# Patient Record
Sex: Female | Born: 1975 | Race: Black or African American | Hispanic: No | Marital: Married | Smoking: Never smoker
Health system: Southern US, Community
[De-identification: ages and names within clinical notes are randomized; demographics above are authoritative.]

## PROBLEM LIST (undated history)

## (undated) DIAGNOSIS — E119 Type 2 diabetes mellitus without complications: Secondary | ICD-10-CM

## (undated) HISTORY — DX: Type 2 diabetes mellitus without complications: E11.9

---

## 2013-05-19 ENCOUNTER — Emergency Department (INDEPENDENT_AMBULATORY_CARE_PROVIDER_SITE_OTHER)
Admission: EM | Admit: 2013-05-19 | Discharge: 2013-05-19 | Disposition: A | Discharge status: Home / Self Care | Payer: Medicaid Other | Source: Home / Self Care | Attending: Family Medicine | Admitting: Family Medicine

## 2013-05-19 ENCOUNTER — Encounter (HOSPITAL_COMMUNITY): Payer: Self-pay | Admitting: Emergency Medicine

## 2013-05-19 DIAGNOSIS — J309 Allergic rhinitis, unspecified: Secondary | ICD-10-CM

## 2013-05-19 MED ORDER — CETIRIZINE HCL 10 MG PO TABS: 10.0000 mg | ORAL_TABLET | Freq: Every day | ORAL | Status: DC

## 2013-05-19 MED ORDER — FLUTICASONE PROPIONATE 50 MCG/ACT NA SUSP: 2.0000 | Freq: Every day | NASAL | Status: DC

## 2013-05-19 NOTE — Discharge Instructions (Signed)
Allergies °Allergies may happen from anything your body is sensitive to. This may be food, medicines, pollens, chemicals, and nearly anything around you in everyday life that produces allergens. An allergen is anything that causes an allergy producing substance. Heredity is often a factor in causing these problems. This means you may have some of the same allergies as your parents. °Food allergies happen in all age groups. Food allergies are some of the most severe and life threatening. Some common food allergies are cow's milk, seafood, eggs, nuts, wheat, and soybeans. °SYMPTOMS  °· Swelling around the mouth. °· An itchy red rash or hives. °· Vomiting or diarrhea. °· Difficulty breathing. °SEVERE ALLERGIC REACTIONS ARE LIFE-THREATENING. °This reaction is called anaphylaxis. It can cause the mouth and throat to swell and cause difficulty with breathing and swallowing. In severe reactions only a trace amount of food (for example, peanut oil in a salad) may cause death within seconds. °Seasonal allergies occur in all age groups. These are seasonal because they usually occur during the same season every year. They may be a reaction to molds, grass pollens, or tree pollens. Other causes of problems are house dust mite allergens, pet dander, and mold spores. The symptoms often consist of nasal congestion, a runny itchy nose associated with sneezing, and tearing itchy eyes. There is often an associated itching of the mouth and ears. The problems happen when you come in contact with pollens and other allergens. Allergens are the particles in the air that the body reacts to with an allergic reaction. This causes you to release allergic antibodies. Through a chain of events, these eventually cause you to release histamine into the blood stream. Although it is meant to be protective to the body, it is this release that causes your discomfort. This is why you were given anti-histamines to feel better.  If you are unable to  pinpoint the offending allergen, it may be determined by skin or blood testing. Allergies cannot be cured but can be controlled with medicine. °Hay fever is a collection of all or some of the seasonal allergy problems. It may often be treated with simple over-the-counter medicine such as diphenhydramine. Take medicine as directed. Do not drink alcohol or drive while taking this medicine. Check with your caregiver or package insert for child dosages. °If these medicines are not effective, there are many new medicines your caregiver can prescribe. Stronger medicine such as nasal spray, eye drops, and corticosteroids may be used if the first things you try do not work well. Other treatments such as immunotherapy or desensitizing injections can be used if all else fails. Follow up with your caregiver if problems continue. These seasonal allergies are usually not life threatening. They are generally more of a nuisance that can often be handled using medicine. °HOME CARE INSTRUCTIONS  °· If unsure what causes a reaction, keep a diary of foods eaten and symptoms that follow. Avoid foods that cause reactions. °· If hives or rash are present: °· Take medicine as directed. °· You may use an over-the-counter antihistamine (diphenhydramine) for hives and itching as needed. °· Apply cold compresses (cloths) to the skin or take baths in cool water. Avoid hot baths or showers. Heat will make a rash and itching worse. °· If you are severely allergic: °· Following a treatment for a severe reaction, hospitalization is often required for closer follow-up. °· Wear a medic-alert bracelet or necklace stating the allergy. °· You and your family must learn how to give adrenaline or use   an anaphylaxis kit. °· If you have had a severe reaction, always carry your anaphylaxis kit or EpiPen® with you. Use this medicine as directed by your caregiver if a severe reaction is occurring. Failure to do so could have a fatal outcome. °SEEK MEDICAL  CARE IF: °· You suspect a food allergy. Symptoms generally happen within 30 minutes of eating a food. °· Your symptoms have not gone away within 2 days or are getting worse. °· You develop new symptoms. °· You want to retest yourself or your child with a food or drink you think causes an allergic reaction. Never do this if an anaphylactic reaction to that food or drink has happened before. Only do this under the care of a caregiver. °SEEK IMMEDIATE MEDICAL CARE IF:  °· You have difficulty breathing, are wheezing, or have a tight feeling in your chest or throat. °· You have a swollen mouth, or you have hives, swelling, or itching all over your body. °· You have had a severe reaction that has responded to your anaphylaxis kit or an EpiPen®. These reactions may return when the medicine has worn off. These reactions should be considered life threatening. °MAKE SURE YOU:  °· Understand these instructions. °· Will watch your condition. °· Will get help right away if you are not doing well or get worse. °Document Released: 05/19/2002 Document Revised: 06/20/2012 Document Reviewed: 10/24/2007 °ExitCare® Patient Information ©2014 [employer]. ° °

## 2013-05-19 NOTE — ED Provider Notes (Signed)
CSN: [id number]     Arrival date & time 05/19/13  [id number] History   None    Chief Complaint  Patient presents with  . URI   (Consider location/radiation/quality/duration/timing/severity/associated sxs/prior Treatment) HPI Comments: History obtained via [employer] interpreters for [place] Arabic  38 year old female who is a recent immigrant [place] 3 [place] ago presents complaining of runny nose, nasal congestion, dry cough, and headache. She relates this to a change in the weather and thinks she may have allergies or a sinus infection. She has never had allergies before. Her symptoms began one week after arriving to the [place], or 2 weeks ago. She denies fever, chills, NVD, chest pain, shortness of breath. She has not tried taking anything for this at home.   History reviewed. No pertinent past medical history. History reviewed. No pertinent past surgical history. History reviewed. No pertinent family history. History  Substance Use Topics  . Smoking status: Never Smoker   . Smokeless tobacco: Not on file  . Alcohol Use: No   OB History   Grav Para Term Preterm Abortions TAB SAB Ect Mult Living                 Review of Systems  Constitutional: Negative for fever and chills.  HENT: Positive for congestion, rhinorrhea and sinus pressure.   Eyes: Negative for visual disturbance.  Respiratory: Positive for cough. Negative for chest tightness and shortness of breath.   Cardiovascular: Negative for chest pain, palpitations and leg swelling.  Gastrointestinal: Negative for nausea, vomiting and abdominal pain.  Endocrine: Negative for polydipsia and polyuria.  Genitourinary: Negative for dysuria, urgency and frequency.  Musculoskeletal: Negative for arthralgias and myalgias.  Skin: Negative for rash.  Neurological: Positive for headaches. Negative for dizziness, weakness and light-headedness.    Allergies  Review of patient's allergies indicates no known  allergies.  Home Medications   Current Outpatient Rx  Name  Route  Sig  Dispense  Refill  . cetirizine (ZYRTEC) 10 MG tablet   Oral   Take 1 tablet (10 mg total) by mouth daily.   30 tablet   0   . fluticasone (FLONASE) 50 MCG/ACT nasal spray   Each Nare   Place 2 sprays into both nostrils daily.   16 g   2    BP 106/69  Pulse 85  Temp(Src) 98.8 F (37.1 C) (Oral)  Resp 16  SpO2 97% Physical Exam  Nursing note and vitals reviewed. Constitutional: She is oriented to person, place, and time. Vital signs are normal. She appears well-developed and well-nourished. No distress.  HENT:  Head: Normocephalic and atraumatic.  Nose: Mucosal edema and rhinorrhea present. Right sinus exhibits no maxillary sinus tenderness and no frontal sinus tenderness. Left sinus exhibits no maxillary sinus tenderness and no frontal sinus tenderness.  Mouth/Throat: Uvula is midline, oropharynx is clear and moist and mucous membranes are normal.  Cardiovascular: Normal rate, regular rhythm and normal heart sounds.  Exam reveals no gallop and no friction rub.   No murmur heard. Pulmonary/Chest: Effort normal and breath sounds normal. No respiratory distress. She has no wheezes. She has no rales.  Lymphadenopathy:       Head (right side): No submandibular, no tonsillar, no preauricular and no posterior auricular adenopathy present.       Head (left side): No submandibular, no tonsillar, no preauricular and no posterior auricular adenopathy present.    She has no cervical adenopathy.  Neurological: She is alert and oriented to person,  place, and time. She has normal strength. Coordination normal.  Skin: Skin is warm and dry. No rash noted. She is not diaphoretic.  Psychiatric: She has a normal mood and affect. Judgment normal.    ED Course  Procedures (including critical care time) Labs Review Labs Reviewed - No data to display Imaging Review No results found.   MDM   1. Allergic rhinitis     Treating allergic rhinitis with daily Zyrtec and daily Flonase. She has a followup appointment for 10 days from now with a new primary care physician so they can assess her and make any changes as needed. Followup here as needed for any worsening. Afebrile without lymphadenopathy or sinus tenderness so do not suspect sinusitis at this time   Meds ordered this encounter  Medications  . fluticasone (FLONASE) 50 MCG/ACT nasal spray    Sig: Place 2 sprays into both nostrils daily.    Dispense:  16 g    Refill:  2    Order Specific Question:  Supervising Provider    Answer:  Clementeen Graham, S [id number]  . cetirizine (ZYRTEC) 10 MG tablet    Sig: Take 1 tablet (10 mg total) by mouth daily.    Dispense:  30 tablet    Refill:  0    Order Specific Question:  Supervising Provider    Answer:  Clementeen Graham, Kathie Rhodes [[id number]]       Graylon Good, PA-C 05/19/13 [phone]

## 2013-05-19 NOTE — ED Notes (Signed)
URI symptoms; does not speak English , will need Education officer, communityacific Interpreter for full triage

## 2013-05-20 NOTE — ED Provider Notes (Signed)
Medical screening examination/treatment/procedure(s) were performed by resident physician or non-physician practitioner and as supervising physician I was immediately available for consultation/collaboration.   KINDL,JAMES DOUGLAS MD.   James D Kindl, MD 05/20/13 1411 

## 2013-05-29 ENCOUNTER — Ambulatory Visit (INDEPENDENT_AMBULATORY_CARE_PROVIDER_SITE_OTHER): Payer: Medicaid Other | Admitting: Family Medicine

## 2013-05-29 ENCOUNTER — Ambulatory Visit: Payer: Self-pay

## 2013-05-29 ENCOUNTER — Encounter: Payer: Self-pay | Admitting: Family Medicine

## 2013-05-29 VITALS — BP 112/72 | HR 90 | Temp 98.4°F | Ht 62.25 in | Wt 138.9 lb

## 2013-05-29 DIAGNOSIS — IMO0001 Reserved for inherently not codable concepts without codable children: Secondary | ICD-10-CM

## 2013-05-29 DIAGNOSIS — Z609 Problem related to social environment, unspecified: Secondary | ICD-10-CM

## 2013-05-29 DIAGNOSIS — Z309 Encounter for contraceptive management, unspecified: Secondary | ICD-10-CM

## 2013-05-29 DIAGNOSIS — Z603 Acculturation difficulty: Secondary | ICD-10-CM

## 2013-05-29 DIAGNOSIS — M549 Dorsalgia, unspecified: Secondary | ICD-10-CM

## 2013-05-29 DIAGNOSIS — M25559 Pain in unspecified hip: Secondary | ICD-10-CM

## 2013-05-29 LAB — POCT URINALYSIS DIPSTICK
Bilirubin, UA: NEGATIVE
Blood, UA: NEGATIVE
Glucose, UA: NEGATIVE
KETONES UA: NEGATIVE
Leukocytes, UA: NEGATIVE
Nitrite, UA: NEGATIVE
PROTEIN UA: NEGATIVE
SPEC GRAV UA: 1.015
Urobilinogen, UA: 0.2
pH, UA: 7

## 2013-05-29 LAB — COMPREHENSIVE METABOLIC PANEL
ALT: 16 U/L (ref 0–35)
AST: 16 U/L (ref 0–37)
Albumin: 4.2 g/dL (ref 3.5–5.2)
Alkaline Phosphatase: 76 U/L (ref 39–117)
BILIRUBIN TOTAL: 0.3 mg/dL (ref 0.2–1.2)
BUN: 17 mg/dL (ref 6–23)
CO2: 26 meq/L (ref 19–32)
Calcium: 9.3 mg/dL (ref 8.4–10.5)
Chloride: 103 mEq/L (ref 96–112)
Creat: 0.78 mg/dL (ref 0.50–1.10)
GLUCOSE: 106 mg/dL — AB (ref 70–99)
POTASSIUM: 3.8 meq/L (ref 3.5–5.3)
SODIUM: 139 meq/L (ref 135–145)
TOTAL PROTEIN: 7 g/dL (ref 6.0–8.3)

## 2013-05-29 LAB — CBC WITH DIFFERENTIAL/PLATELET
Basophils Absolute: 0 10*3/uL (ref 0.0–0.1)
Basophils Relative: 0 % (ref 0–1)
Eosinophils Absolute: 0.1 10*3/uL (ref 0.0–0.7)
Eosinophils Relative: 1 % (ref 0–5)
HEMATOCRIT: 37.6 % (ref 36.0–46.0)
HEMOGLOBIN: 12.5 g/dL (ref 12.0–15.0)
LYMPHS ABS: 1.5 10*3/uL (ref 0.7–4.0)
Lymphocytes Relative: 25 % (ref 12–46)
MCH: 26.3 pg (ref 26.0–34.0)
MCHC: 33.2 g/dL (ref 30.0–36.0)
MCV: 79 fL (ref 78.0–100.0)
MONO ABS: 0.3 10*3/uL (ref 0.1–1.0)
Monocytes Relative: 5 % (ref 3–12)
NEUTROS PCT: 69 % (ref 43–77)
Neutro Abs: 4.2 10*3/uL (ref 1.7–7.7)
Platelets: 295 10*3/uL (ref 150–400)
RBC: 4.76 MIL/uL (ref 3.87–5.11)
RDW: 14.6 % (ref 11.5–15.5)
WBC: 6.1 10*3/uL (ref 4.0–10.5)

## 2013-05-29 MED ORDER — NORGESTIMATE-ETH ESTRADIOL 0.25-35 MG-MCG PO TABS: 1.0000 | ORAL_TABLET | Freq: Every day | ORAL | Status: DC

## 2013-05-29 MED ORDER — IBUPROFEN 600 MG PO TABS: 600.0000 mg | ORAL_TABLET | Freq: Three times a day (TID) | ORAL | Status: DC | PRN

## 2013-05-29 NOTE — Progress Notes (Signed)
Patient ID: Destiny Walker, female   DOB: 09/12/1975, 38 y.o.   MRN: [id number]  Chief Complaint  Patient presents with  . Establish Care     HPI Destiny Walker is a 38 y.o.female who presents to immigrant clinic as a new patient. Arabic Interpreter is present for the office visit.  Her concerns include the following:  - pain in right lateral upper leg. Started 4-5 months ago when she fell on her bottom. Pain is worst at night and worst with laying on it. Pain is a stretchy type of pain that spans from the top of her lateral thigh past her knee on the lateral side. She denies any lower back pain. No lower extr weakness, numbness or bowel or urinary incontinence  - tooth ache in the upper left jaw. Has partial dentures and would like to see dentist.   Social History:  Arrived in [place]: 2 months ago Originally from [place] and lived [place] for the last 3 years. Her family left [place] due to political unrest.   Language: - Arabic and a [place] dialect - Requires Administratorintepreter (essentially speaks no [place])  Education: - 5th grade education in [place] - no formal employment. Works in the home  Preventative Care History: none Seen at Health Department: no  Contact:  [employer] Other:    History reviewed. No pertinent past medical history.  History reviewed. No pertinent past surgical history.  History reviewed. No pertinent family history.  Social History History  Substance Use Topics  . Smoking status: Never Smoker   . Smokeless tobacco: Not on file  . Alcohol Use: No    No Known Allergies  Current Outpatient Prescriptions  Medication Sig Dispense Refill  . cetirizine (ZYRTEC) 10 MG tablet Take 1 tablet (10 mg total) by mouth daily.  30 tablet  0  . fluticasone (FLONASE) 50 MCG/ACT nasal spray Place 2 sprays into both nostrils daily.  16 g  2  . ibuprofen (ADVIL,MOTRIN) 600 MG tablet Take 1 tablet (600 mg total) by mouth every 8 (eight) hours  as needed.  30 tablet  1  . norgestimate-ethinyl estradiol (ORTHO-CYCLEN,SPRINTEC,PREVIFEM) 0.25-35 MG-MCG tablet Take 1 tablet by mouth daily.  1 Package  11   No current facility-administered medications for this visit.    Review of Systems Review of Systems Negative except per HPI Blood pressure 112/72, pulse 90, temperature 98.4 F (36.9 C), temperature source Oral, height 5' 2.25" (1.581 m), weight 138 lb 14.4 oz (63.005 kg).  Physical Exam Physical Exam General: veiled woman with headdress. Pleasant, alert and oriented x4, no acute distress.  HEENT: PERRLA, EOMI, moist mucous membranes, partial dentures present on right upper front row of teeth, dental plaque present on teeth, no obvious cavities. Erythematous and congested nasal turbinates bilaterally CV: S1S2, RRR, no murmur Pulm: CTA bilaterally, normal work of breathing Abdomen: normal bowel sounds, non tender, non distended Right upper leg: no tenderness along greater trochanter, normal flexion and extension of hip, difficulty with FABER on right, pain with internal rotation of hip. Normal abduction strength bilaterally No tenderness along lower back Extremities: no pedal edema Data Reviewed [employer]  Assessment    38 yo female, [place] refugee who presents to establish care    Plan    1. Refugee Health: check UA, UCx, CMP, CBC 2. Lateral thigh pain: no evidence of trochanteric bursitis or low back pain. treat with NSAIDs for now and monitor improvement. If not better, consider xray  3. Contraception: patient asking for OCP's. rx for sprintec sent 4. Dental pain: Medicaid Dentist list given      Destiny Walker 05/29/2013, 10:10 PM

## 2013-05-29 NOTE — Assessment & Plan Note (Signed)
no evidence of trochanteric bursitis or low back pain. treat with NSAIDs for now and monitor improvement. If not better, consider imaging

## 2013-05-30 LAB — URINE CULTURE

## 2013-08-23 ENCOUNTER — Emergency Department (HOSPITAL_COMMUNITY)
Admission: EM | Admit: 2013-08-23 | Discharge: 2013-08-24 | Disposition: A | Discharge status: Home / Self Care | Payer: Medicaid Other | Attending: Emergency Medicine | Admitting: Emergency Medicine

## 2013-08-23 ENCOUNTER — Encounter (HOSPITAL_COMMUNITY): Payer: Self-pay | Admitting: Emergency Medicine

## 2013-08-23 DIAGNOSIS — K089 Disorder of teeth and supporting structures, unspecified: Secondary | ICD-10-CM | POA: Insufficient documentation

## 2013-08-23 DIAGNOSIS — K0889 Other specified disorders of teeth and supporting structures: Secondary | ICD-10-CM

## 2013-08-23 MED ORDER — OXYCODONE-ACETAMINOPHEN 5-325 MG PO TABS
1.0000 | ORAL_TABLET | Freq: Once | ORAL | Status: AC
  Administered 2013-08-23: 1 via ORAL
  Filled 2013-08-23: qty 1

## 2013-08-23 NOTE — ED Notes (Signed)
**  Information provided through family in room, pt will need Arabic interpretor  Pt c/o R lower dental pain x 2 weeks, tearful at times per family. No relief with orajel and Ibuprofen.

## 2013-08-23 NOTE — ED Notes (Signed)
Pt's friend at bedside reports pt has been c/o R lower dental pain x 1 month now.  Does not have a dentist because pt recently moved here.

## 2013-08-23 NOTE — ED Notes (Addendum)
CULTURAL NOTE: IF PT NEEDS TO UNDRESS ANY PART OF BODY, MALES NEED TO LEAVE RM SO AS NOT OFFEND THE PT. ALSO, ONLY FEMALES ARE PREFERRED FOR ALL TESTS AND PROCEDURES

## 2013-08-24 MED ORDER — HYDROCODONE-ACETAMINOPHEN 5-325 MG PO TABS: 1.0000 | ORAL_TABLET | ORAL | Status: DC | PRN

## 2013-08-24 NOTE — Discharge Instructions (Signed)
Take the prescribed medication as directed for pain. Follow-up with Dr. Lawrence Marseillesivils-- call and schedule appt. Return to the ED for new or worsening symptoms.

## 2013-08-24 NOTE — ED Provider Notes (Signed)
Medical screening examination/treatment/procedure(s) were performed by non-physician practitioner and as supervising physician I was immediately available for consultation/collaboration.   EKG Interpretation None        Najmo Pardue M Young Brim, MD 08/24/13 0357 

## 2013-08-24 NOTE — ED Provider Notes (Signed)
CSN: [id number]     Arrival date & time 08/23/13  2051 History   First MD Initiated Contact with Patient 08/23/13 2225     Chief Complaint  Patient presents with  . Dental Pain     (Consider location/radiation/quality/duration/timing/severity/associated sxs/prior Treatment) Patient is a 38 y.o. female presenting with tooth pain. The history is provided by the patient and medical records. The history is limited by a language barrier. A language interpreter was used.  Dental Pain  This is a 38 year old female presenting to the ED for right lower dental pain for the past 2 weeks.  Patient denies any any oral or dental trauma.  She denies any fever or chills.  Denies any facial swelling.  She is able to eat and drink without difficulty.  She's been taking ibuprofen and using topical Orajel without improvement. Patient is not currently established with a dentist.  History reviewed. No pertinent past medical history. History reviewed. No pertinent past surgical history. No family history on file. History  Substance Use Topics  . Smoking status: Never Smoker   . Smokeless tobacco: Not on file  . Alcohol Use: No   OB History   Grav Para Term Preterm Abortions TAB SAB Ect Mult Living                 Review of Systems  HENT: Positive for dental problem.   All other systems reviewed and are negative.     Allergies  Review of patient's allergies indicates no known allergies.  Home Medications   Prior to Admission medications   Medication Sig Start Date End Date Taking? Authorizing Provider  benzocaine (ORAJEL) 10 % mucosal gel Use as directed 1 application in the mouth or throat as needed for mouth pain.   Yes Historical Provider, MD  ibuprofen (ADVIL,MOTRIN) 200 MG tablet Take 400-600 mg by mouth every 6 (six) hours as needed (for pain).   Yes Historical Provider, MD   BP 149/73  Pulse 89  Temp(Src) 98.5 F (36.9 C) (Oral)  Resp 18  SpO2 99%  LMP 08/11/2013  Physical Exam   Nursing note and vitals reviewed. Constitutional: She is oriented to person, place, and time. She appears well-developed and well-nourished. No distress.  HENT:  Head: Normocephalic and atraumatic.  Mouth/Throat: Uvula is midline, oropharynx is clear and moist and mucous membranes are normal. No trismus in the jaw. Normal dentition. No dental abscesses or dental caries. No oropharyngeal exudate, posterior oropharyngeal edema, posterior oropharyngeal erythema or tonsillar abscesses.  Teeth largely in good dentition, right lower pre-molar TTP, surrounding gingiva normal in appearance without swelling or erythema to suggest dental abscess, handling secretions appropriately, no trismus  Eyes: Conjunctivae and EOM are normal. Pupils are equal, round, and reactive to light.  Neck: Normal range of motion. Neck supple.  Cardiovascular: Normal rate, regular rhythm and normal heart sounds.   Pulmonary/Chest: Effort normal and breath sounds normal. No respiratory distress. She has no wheezes.  Musculoskeletal: Normal range of motion.  Neurological: She is alert and oriented to person, place, and time.  Skin: Skin is warm and dry. She is not diaphoretic.  Psychiatric: She has a normal mood and affect.    ED Course  Procedures (including critical care time) Labs Review Labs Reviewed - No data to display  Imaging Review No results found.   EKG Interpretation None      MDM   Final diagnoses:  Pain, dental   Dental pain without signs of dental abscess. Patient was given  Percocet while in the ED with good improvement of her pain.  She will be discharged home with vicodin for pain control.  FU with dentist, referrals for Nuala AlphaJanna Civils given as patient prefers female physicians.  Discussed plan with patient, he/she acknowledged understanding and agreed with plan of care.  Return precautions given for new or worsening symptoms.  Garlon HatchetLisa M Sanders, PA-C 08/24/13 [phone]

## 2013-08-31 ENCOUNTER — Ambulatory Visit: Payer: Medicaid Other | Admitting: Family Medicine

## 2013-09-15 ENCOUNTER — Ambulatory Visit: Payer: Medicaid Other | Admitting: Family Medicine

## 2013-09-19 ENCOUNTER — Other Ambulatory Visit (HOSPITAL_COMMUNITY)
Admission: RE | Admit: 2013-09-19 | Discharge: 2013-09-19 | Disposition: A | Discharge status: Home / Self Care | Payer: Medicaid Other | Source: Ambulatory Visit | Attending: Family Medicine | Admitting: Family Medicine

## 2013-09-19 ENCOUNTER — Ambulatory Visit (INDEPENDENT_AMBULATORY_CARE_PROVIDER_SITE_OTHER): Payer: Medicaid Other | Admitting: Family Medicine

## 2013-09-19 ENCOUNTER — Encounter: Payer: Self-pay | Admitting: Family Medicine

## 2013-09-19 VITALS — BP 104/83 | HR 96 | Temp 99.8°F | Ht 62.25 in | Wt 139.0 lb

## 2013-09-19 DIAGNOSIS — Z113 Encounter for screening for infections with a predominantly sexual mode of transmission: Secondary | ICD-10-CM | POA: Insufficient documentation

## 2013-09-19 DIAGNOSIS — M6740 Ganglion, unspecified site: Secondary | ICD-10-CM

## 2013-09-19 DIAGNOSIS — N949 Unspecified condition associated with female genital organs and menstrual cycle: Secondary | ICD-10-CM

## 2013-09-19 DIAGNOSIS — R109 Unspecified abdominal pain: Secondary | ICD-10-CM

## 2013-09-19 DIAGNOSIS — R102 Pelvic and perineal pain: Secondary | ICD-10-CM

## 2013-09-19 NOTE — Patient Instructions (Signed)
We will get you set up for the ultrasound.  We are checking for infection.  Come back in 2-3 weeks so we can talk about your ultrasound results.  You can see me in my regular clinic or [facility].    It was good to see you today.

## 2013-09-19 NOTE — Progress Notes (Signed)
Subjective:    Destiny Walker is a 38 y.o. female who presents [facility] today for pelvic pain with menses:  1.  Pelvic pain with menses:  Present for 2 years (since birth of her youngest son) but worse in past 5 months.  No inciting triggers that she knows of.  Describes cramping abdominal pain which begins several days before menses.  Bleeding also heavier and more irregular than usual.  LMP was 2 weeks ago.  Still spotting somewhat.  Has not tried anything for relief.    No fevers or chills.  No nausea or vomiting.  Eating and drinking well.   Was told -- while in [place] -- that she had "uterine infection" and given medicine but never took it.  This was several months ago.  Describes some light white discharge.  No itching or burning.    2.  Left wrist pain:  Has "knot" on dorsum of her left wrist present for several months.  States appeared [place], but resolved ("popped") when she fell on it.    ROS as above per HPI, otherwise neg.    The following portions of the patient's history were reviewed and updated as appropriate: allergies, current medications, past medical history, family and social history, and problem list. Patient is a nonsmoker.    PMH reviewed.  No past medical history on file. No past surgical history on file.  Medications reviewed. Current Outpatient Prescriptions  Medication Sig Dispense Refill  . benzocaine (ORAJEL) 10 % mucosal gel Use as directed 1 application in the mouth or throat as needed for mouth pain.      Marland Kitchen. HYDROcodone-acetaminophen (NORCO/VICODIN) 5-325 MG per tablet Take 1 tablet by mouth every 4 (four) hours as needed.  12 tablet  0  . ibuprofen (ADVIL,MOTRIN) 200 MG tablet Take 400-600 mg by mouth every 6 (six) hours as needed (for pain).       No current facility-administered medications for this visit.     Objective:   Physical Exam BP 104/83  Pulse 96  Temp(Src) 99.8 F (37.7 C) (Oral)  Ht 5' 2.25" (1.581  m)  Wt 139 lb (63.05 kg)  BMI 25.22 kg/m2  LMP 09/05/2013 Gen:  Alert, cooperative patient who appears stated age in no acute distress.  Vital signs reviewed. HEENT: EOMI,  MMM Cardiac:  Regular rate and rhythm without murmur auscultated.   Pulm:  Clear to auscultation bilaterally with good air movement.   Abd:  Soft/nondistended.  RLQ tenderness present, mild.  No guarding or rebound.   Ext:  Left wrist with 1.5 cm nodule consistent with ganglion cyst on dorsum of Left hand.  Non-erythematous.    No results found for this or any previous visit (from the past 72 hour(s)).

## 2013-09-20 DIAGNOSIS — R102 Pelvic and perineal pain unspecified side: Secondary | ICD-10-CM | POA: Insufficient documentation

## 2013-09-20 DIAGNOSIS — M6740 Ganglion, unspecified site: Secondary | ICD-10-CM | POA: Insufficient documentation

## 2013-09-20 NOTE — Assessment & Plan Note (Signed)
Painful menstrual cramping.  Ibuprofen 800 mg for relief for now. With focal findings on exam.  Doesn't sound like she'd have a tubo-ovarian abscess -- but will obtain US to look for this or hemorrhagic cyst.

## 2013-09-20 NOTE — Assessment & Plan Note (Signed)
Desires treatment.   Did not have time today, but after discussion with patient will aspirate and provide corticosteroid injection at next visit in 2 weeks.

## 2013-09-25 ENCOUNTER — Ambulatory Visit (HOSPITAL_COMMUNITY): Payer: Medicaid Other | Attending: Family Medicine

## 2013-10-06 ENCOUNTER — Ambulatory Visit (INDEPENDENT_AMBULATORY_CARE_PROVIDER_SITE_OTHER): Payer: Medicaid Other | Admitting: Family Medicine

## 2013-10-06 ENCOUNTER — Encounter: Payer: Self-pay | Admitting: Family Medicine

## 2013-10-06 VITALS — BP 133/84 | HR 83 | Temp 98.4°F | Ht 62.25 in | Wt 139.0 lb

## 2013-10-06 DIAGNOSIS — N949 Unspecified condition associated with female genital organs and menstrual cycle: Secondary | ICD-10-CM

## 2013-10-06 DIAGNOSIS — IMO0001 Reserved for inherently not codable concepts without codable children: Secondary | ICD-10-CM | POA: Insufficient documentation

## 2013-10-06 DIAGNOSIS — Z309 Encounter for contraceptive management, unspecified: Secondary | ICD-10-CM

## 2013-10-06 DIAGNOSIS — Z30019 Encounter for initial prescription of contraceptives, unspecified: Secondary | ICD-10-CM

## 2013-10-06 DIAGNOSIS — M6740 Ganglion, unspecified site: Secondary | ICD-10-CM

## 2013-10-06 DIAGNOSIS — Z3009 Encounter for other general counseling and advice on contraception: Secondary | ICD-10-CM

## 2013-10-06 DIAGNOSIS — R102 Pelvic and perineal pain: Secondary | ICD-10-CM

## 2013-10-06 LAB — POCT URINE PREGNANCY: Preg Test, Ur: NEGATIVE

## 2013-10-06 MED ORDER — MEDROXYPROGESTERONE ACETATE 150 MG/ML IM SUSP
150.0000 mg | Freq: Once | INTRAMUSCULAR | Status: AC
  Administered 2013-10-06: 150 mg via INTRAMUSCULAR

## 2013-10-06 MED ORDER — NORGESTIMATE-ETH ESTRADIOL 0.25-35 MG-MCG PO TABS: 1.0000 | ORAL_TABLET | Freq: Every day | ORAL | Status: DC

## 2013-10-06 NOTE — Assessment & Plan Note (Signed)
Informed consent obtained and placed in chart.  Time out performed.  Area cleaned with iodine x 3 and wiped clear with alcohol swab.  Using 25 gauge needle 0.5 cc Lidocaine placed just peripheral to cyst.  No blood aspirated.  Waited a few moments.  THen, using 19 gauge needle, attempted aspiration of fluid contents.  None removed.  Change syringes and injected 1 CC of depo-medrol 40 into ganglion cyst.  Felt gentle "pop" and noted extravasation of fluid.   Sterile bandage placed.  Patient tolerated procedure well.  No complications.  No pain.  Discussed likelihood of hypopigmentation here and that this would likely not be permanent.

## 2013-10-06 NOTE — Assessment & Plan Note (Signed)
Long discussion with patient about risks/benefits of OCPs at her age. After counseling and discussing risks of Depo-provera, she desires Depo today.   Upreg negative Depo given.   FU in 3 months for repeat

## 2013-10-06 NOTE — Assessment & Plan Note (Addendum)
Needs U/S Rescheduled today.  Fibroids and endometriosis are leading differential. To FU with me after she receives US for results.

## 2013-10-06 NOTE — Patient Instructions (Signed)
It was good to see you today!

## 2013-10-06 NOTE — Progress Notes (Signed)
Subjective:    Destiny Walker is a 38 y.o. female who [facility] today for several issues:  1.  Missed U/S appt:  Still with abdominal cramping, worse menses.  Missed US appt.  Pain described as cramping in nature, inconsistently releived with OTC analgesics.  Bleeding is normal.    2.  Contraception.  Interested in contraception.  Would like OCPs.  Wants to be sexually active with husband but wants no more children.  Denies migraines with aura, smoking.  3.  Ganglion cyst aspiration:  Still with pain when flexes her wrist.  Would like this aspirated/injected today.  No redness or heat.    Prev health: Needs Pap smear with female provider.   ROS as above per HPI, otherwise neg.    The following portions of the patient's history were reviewed and updated as appropriate: allergies, current medications, past medical history, family and social history, and problem list. Patient is a nonsmoker.    PMH reviewed.  No past medical history on file. No past surgical history on file.  Medications reviewed. Current Outpatient Prescriptions  Medication Sig Dispense Refill  . benzocaine (ORAJEL) 10 % mucosal gel Use as directed 1 application in the mouth or throat as needed for mouth pain.      Marland Kitchen. HYDROcodone-acetaminophen (NORCO/VICODIN) 5-325 MG per tablet Take 1 tablet by mouth every 4 (four) hours as needed.  12 tablet  0  . ibuprofen (ADVIL,MOTRIN) 200 MG tablet Take 400-600 mg by mouth every 6 (six) hours as needed (for pain).      . norgestimate-ethinyl estradiol (SPRINTEC 28) 0.25-35 MG-MCG tablet Take 1 tablet by mouth daily.  1 Package  11   No current facility-administered medications for this visit.     Objective:   Physical Exam BP 133/84  Pulse 83  Temp(Src) 98.4 F (36.9 C) (Oral)  Ht 5' 2.25" (1.581 m)  Wt 139 lb (63.05 kg)  BMI 25.22 kg/m2  LMP 09/05/2013 Gen:  Alert, cooperative patient who appears stated age in no acute distress.  Vital signs reviewed. HEENT:  EOMI,  MMM Cardiac:  Regular rate and rhythm without murmur auscultated.  Good S1/S2. Pulm:  Clear to auscultation bilaterally with good air movement.  No wheezes or rales noted.   Left wrist:  1 cm ganglion cyst in place dorsum of Left wrist.   Exts: Non edematous BL  LE, warm and well perfused.   Results for orders placed in visit on 10/06/13 (from the past 72 hour(s))  POCT URINE PREGNANCY     Status: None   Collection Time    10/06/13 12:39 PM      Result Value Ref Range   Preg Test, Ur Negative

## 2013-10-13 ENCOUNTER — Ambulatory Visit (HOSPITAL_COMMUNITY): Payer: Medicaid Other

## 2013-10-19 ENCOUNTER — Ambulatory Visit (HOSPITAL_COMMUNITY)
Admission: RE | Admit: 2013-10-19 | Discharge: 2013-10-19 | Disposition: A | Discharge status: Home / Self Care | Payer: Medicaid Other | Source: Ambulatory Visit | Attending: Family Medicine | Admitting: Family Medicine

## 2013-10-19 DIAGNOSIS — R109 Unspecified abdominal pain: Secondary | ICD-10-CM | POA: Insufficient documentation

## 2013-10-19 DIAGNOSIS — N949 Unspecified condition associated with female genital organs and menstrual cycle: Secondary | ICD-10-CM | POA: Diagnosis present

## 2013-10-19 DIAGNOSIS — R102 Pelvic and perineal pain: Secondary | ICD-10-CM

## 2014-01-16 ENCOUNTER — Encounter (HOSPITAL_COMMUNITY): Payer: Self-pay | Admitting: Adult Health

## 2014-01-16 ENCOUNTER — Emergency Department (HOSPITAL_COMMUNITY)
Admission: EM | Admit: 2014-01-16 | Discharge: 2014-01-16 | Disposition: A | Discharge status: Home / Self Care | Payer: Medicaid Other | Attending: Emergency Medicine | Admitting: Emergency Medicine

## 2014-01-16 DIAGNOSIS — S161XXA Strain of muscle, fascia and tendon at neck level, initial encounter: Secondary | ICD-10-CM | POA: Insufficient documentation

## 2014-01-16 DIAGNOSIS — Y92410 Unspecified street and highway as the place of occurrence of the external cause: Secondary | ICD-10-CM | POA: Insufficient documentation

## 2014-01-16 DIAGNOSIS — Y998 Other external cause status: Secondary | ICD-10-CM | POA: Insufficient documentation

## 2014-01-16 DIAGNOSIS — R51 Headache: Secondary | ICD-10-CM | POA: Diagnosis present

## 2014-01-16 DIAGNOSIS — Y9389 Activity, other specified: Secondary | ICD-10-CM | POA: Diagnosis not present

## 2014-01-16 DIAGNOSIS — Z79899 Other long term (current) drug therapy: Secondary | ICD-10-CM | POA: Diagnosis not present

## 2014-01-16 DIAGNOSIS — F0781 Postconcussional syndrome: Secondary | ICD-10-CM | POA: Diagnosis not present

## 2014-01-16 LAB — POC URINE PREG, ED: Preg Test, Ur: NEGATIVE

## 2014-01-16 MED ORDER — IBUPROFEN 600 MG PO TABS: 600.0000 mg | ORAL_TABLET | Freq: Three times a day (TID) | ORAL | Status: DC | PRN

## 2014-01-16 MED ORDER — ONDANSETRON HCL 4 MG PO TABS: 4.0000 mg | ORAL_TABLET | Freq: Four times a day (QID) | ORAL | Status: DC
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
## 2014-01-16 NOTE — Discharge Instructions (Signed)
Call for a follow up appointment with a Family or Primary Care Provider.             . daewat l mutabaeat maweid mae aleayilat 'aw muqaddim alrrieayat alsshhiat al'awwalia .  Return if Symptoms worsen.       Marland Kitchen. aleawdat 'iidha sa'at al'aerad .  Alternate Ice and heat to reduce neck discomfort.        Marland Kitchen. karim badil walhararat lilhadd min edm alrrahat alrraqibat.

## 2014-01-16 NOTE — ED Notes (Signed)
Patient transported to CT 

## 2014-01-22 ENCOUNTER — Ambulatory Visit (INDEPENDENT_AMBULATORY_CARE_PROVIDER_SITE_OTHER): Payer: Medicaid Other | Admitting: *Deleted

## 2014-01-25 ENCOUNTER — Ambulatory Visit: Payer: Medicaid Other | Admitting: Family Medicine

## 2014-02-05 ENCOUNTER — Ambulatory Visit (INDEPENDENT_AMBULATORY_CARE_PROVIDER_SITE_OTHER): Payer: Medicaid Other | Admitting: *Deleted

## 2014-02-05 DIAGNOSIS — Z3042 Encounter for surveillance of injectable contraceptive: Secondary | ICD-10-CM

## 2014-02-05 LAB — POCT URINE PREGNANCY: Preg Test, Ur: NEGATIVE

## 2014-02-05 MED ORDER — MEDROXYPROGESTERONE ACETATE 150 MG/ML IM SUSP
150.0000 mg | Freq: Once | INTRAMUSCULAR | Status: AC
  Administered 2014-02-05: 150 mg via INTRAMUSCULAR

## 2014-02-05 NOTE — Progress Notes (Signed)
   Pt late for Depo Provera injection.  Pregnancy test ordered; results negative.  Pt advised to use another reliable form of birth control for the next 7-10 days.Pt tolerated Depo injection. Depo given right upper outer quadrant.  Next injection due Feb 15-May 08, 2014.  Reminder card given. Clovis PuMartin, Ottilia Pippenger L, RN

## 2014-03-13 ENCOUNTER — Ambulatory Visit (INDEPENDENT_AMBULATORY_CARE_PROVIDER_SITE_OTHER): Payer: Medicaid Other | Admitting: Family Medicine

## 2014-03-13 ENCOUNTER — Encounter: Payer: Self-pay | Admitting: Family Medicine

## 2014-03-13 VITALS — BP 119/62 | HR 85 | Temp 99.0°F | Ht 62.0 in | Wt 149.1 lb

## 2014-03-13 DIAGNOSIS — M542 Cervicalgia: Secondary | ICD-10-CM

## 2014-03-13 DIAGNOSIS — R51 Headache: Secondary | ICD-10-CM

## 2014-03-13 DIAGNOSIS — R519 Headache, unspecified: Secondary | ICD-10-CM

## 2014-03-13 DIAGNOSIS — G8929 Other chronic pain: Secondary | ICD-10-CM

## 2014-03-13 DIAGNOSIS — M6740 Ganglion, unspecified site: Secondary | ICD-10-CM

## 2014-03-13 MED ORDER — IBUPROFEN 600 MG PO TABS: 600.0000 mg | ORAL_TABLET | Freq: Three times a day (TID) | ORAL | Status: DC | PRN
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
## 2014-03-13 NOTE — Patient Instructions (Signed)
Take the Ibuprofen for your headache when it hurts.  We will get you scheduled for your wrist.   ------------------------------------------------------------------  I DO NOT SPEAK VERY MUCH ENGLISH.  PLEASE HELP ME FIND THE RADIOLOGY DEPARTMENT.    MY DOCTOR WOULD LIKE ME TO HAVE AN XRAY

## 2014-03-14 DIAGNOSIS — R519 Headache, unspecified: Secondary | ICD-10-CM | POA: Insufficient documentation

## 2014-03-14 DIAGNOSIS — R51 Headache: Secondary | ICD-10-CM

## 2014-03-14 NOTE — Assessment & Plan Note (Signed)
Attempted aspiration/drainage.  Better for a few months but has now recurred.   Will refer to surgery for definitive removal.

## 2014-03-14 NOTE — Assessment & Plan Note (Signed)
Likely secondary to neck spasm.   Will acquire cervical films as still with pain.  Ibuprofen for relief.  Will call with radiograph results.

## 2014-03-15 ENCOUNTER — Ambulatory Visit (HOSPITAL_COMMUNITY)
Admission: RE | Admit: 2014-03-15 | Discharge: 2014-03-15 | Disposition: A | Discharge status: Home / Self Care | Payer: Medicaid Other | Source: Ambulatory Visit | Attending: Family Medicine | Admitting: Family Medicine

## 2014-03-15 DIAGNOSIS — M542 Cervicalgia: Secondary | ICD-10-CM | POA: Diagnosis present

## 2014-03-15 DIAGNOSIS — G8929 Other chronic pain: Secondary | ICD-10-CM

## 2014-03-15 NOTE — Addendum Note (Signed)
Addended byGwendolyn Grant: Pearly Apachito H on: 03/15/2014 10:18 AM   Modules accepted: Orders

## 2014-03-21 ENCOUNTER — Encounter: Payer: Self-pay | Admitting: Family Medicine

## 2014-04-04 ENCOUNTER — Telehealth: Payer: Self-pay | Admitting: Family Medicine

## 2014-04-04 NOTE — Telephone Encounter (Signed)
Pt was seen at Dr. Merrilee SeashoreKuzma's office and he has decided not to take on her as a patient. jw

## 2014-04-06 NOTE — Telephone Encounter (Signed)
Did he give a reason why?

## 2014-04-06 NOTE — Telephone Encounter (Signed)
Called Dr. Merrilee SeashoreKuzma's office at [phone] to inquire why they did not take her on as a pt and the receptionist stated that it was probably due to her insurance.  Stated that they are bombarded by Medicaid pt right now. Destiny Walker, April D

## 2014-06-29 ENCOUNTER — Ambulatory Visit (INDEPENDENT_AMBULATORY_CARE_PROVIDER_SITE_OTHER): Payer: Medicaid Other | Admitting: Family Medicine

## 2014-06-29 ENCOUNTER — Encounter: Payer: Self-pay | Admitting: Family Medicine

## 2014-06-29 VITALS — BP 114/88 | HR 99 | Temp 98.3°F | Ht 62.0 in | Wt 154.0 lb

## 2014-06-29 DIAGNOSIS — N920 Excessive and frequent menstruation with regular cycle: Secondary | ICD-10-CM | POA: Insufficient documentation

## 2014-06-29 DIAGNOSIS — M62838 Other muscle spasm: Secondary | ICD-10-CM

## 2014-06-29 DIAGNOSIS — M6740 Ganglion, unspecified site: Secondary | ICD-10-CM | POA: Diagnosis present

## 2014-06-29 DIAGNOSIS — M62830 Muscle spasm of back: Secondary | ICD-10-CM | POA: Insufficient documentation

## 2014-06-29 MED ORDER — IBUPROFEN 600 MG PO TABS: 600.0000 mg | ORAL_TABLET | Freq: Three times a day (TID) | ORAL | Status: DC | PRN

## 2014-06-29 NOTE — Assessment & Plan Note (Signed)
Palpable muscle spasm upper thoracic area, think this is contributing to chest wall pain as well.   Provided stretching exercises.  Discussed heat and massage for relief. Ibuprofen will help with pain relief here as well.

## 2014-06-29 NOTE — Assessment & Plan Note (Signed)
Think this is secondary to irregular Depo use.   Long discussion with patient about treatment for this.  She desires NOT to be on contraception.  After discussion, plan is to treat with extra strength Ibuprofen to help with cramping symptoms and lighten bleeding.   To FU with me in 2 months or so to ensure that bleeding is lightening.  Last Depo was in November, she should be started to have more regular bleeding by then.

## 2014-06-29 NOTE — Patient Instructions (Addendum)
It was good to see you again today.  Let me know if you would like to try the birth control to make your periods regular.  I have sent in the medicine for you.  This will also help your back pain and period cramping.  Use heat and massage for your back as well.   I will let you know about the wrist surgery and where we can do this.    Come back to see me in 3 months.

## 2014-06-29 NOTE — Progress Notes (Signed)
Subjective:    Destiny Walker is a 39 y.o. female who [facility] today for several issues:  1.  Ganglion cyst: As noted in prior OV notes, this has recurred.  She would like definitive treatment.  [place] had declined seeing the patient because of her Medicaid status.  She feels her cyst is not growing currently, but it is painful when bumps it or dorsiflexes her hand.    2.  Questions about depo/irregular bleeding:  Patient previously had regular periods prior to being started on Depo several years ago back in [place].  She received irregular Depo after her arrival in the [place].  Last was in November.  She began having irregular (described as prolonged) periods in January.  Lasted about 10 day.  Recurred regularly in Feb, March, and 3 days ago this month.  She is concerned for prolonged bleeding.  No lightheadedness.  No palpitations.    3.  Back pain:  Present for about 1 year.  Left upper thoracic region, under her shoulder blade.  Feels like sharp stabbing pain here.  Occasionally radiates to Left chest.  No dyspnea, no chest pain on exertion.  Worse when moving arm.  Has not tried anything for relief.   ROS as above per HPI, otherwise neg.    The following portions of the patient's history were reviewed and updated as appropriate: allergies, current medications, past medical history, family and social history, and problem list. Patient is a nonsmoker.    PMH reviewed.  No past medical history on file. No past surgical history on file.  Medications reviewed. Current Outpatient Prescriptions  Medication Sig Dispense Refill  . benzocaine (ORAJEL) 10 % mucosal gel Use as directed 1 application in the mouth or throat as needed for mouth pain.    Marland Kitchen. HYDROcodone-acetaminophen (NORCO/VICODIN) 5-325 MG per tablet Take 1 tablet by mouth every 4 (four) hours as needed. 12 tablet 0  . ibuprofen (ADVIL,MOTRIN) 600 MG tablet Take 1 tablet (600 mg total) by mouth every 8 (eight)  hours as needed. 30 tablet 1  . norgestimate-ethinyl estradiol (SPRINTEC 28) 0.25-35 MG-MCG tablet Take 1 tablet by mouth daily. 1 Package 11  . ondansetron (ZOFRAN) 4 MG tablet Take 1 tablet (4 mg total) by mouth every 6 (six) hours. 12 tablet 0   No current facility-administered medications for this visit.     Objective:   Physical Exam BP 114/88 mmHg  Pulse 99  Temp(Src) 98.3 F (36.8 C) (Oral)  Ht 5\' 2"  (1.575 m)  Wt 154 lb (69.854 kg)  BMI 28.16 kg/m2 Gen:  Alert, cooperative patient who appears stated age in no acute distress.  Vital signs reviewed. HEENT: EOMI,  MMM Cardiac:  Regular rate and rhythm  Pulm:  Clear to auscultation bilaterally with good air movement.     MSK:  TTP along Left scapula extending to Left trapezius.  I feel palpable spasm here.  Able to turn head and flex/extend neck easily without pain.  Some chest wall tenderness at well, ribs 3-5. Exts: 1.5 cm in diameter nodule consistent with ganglion cyst on dorsum of Left hand.    No results found for this or any previous visit (from the past 72 hour(s)).

## 2014-06-29 NOTE — Assessment & Plan Note (Signed)
Patient desires definitive referral - meaning surgery.  [facility] [place]  Family has transportation.  Declines another attempt at aspiration and injection today.

## 2014-09-07 ENCOUNTER — Ambulatory Visit (INDEPENDENT_AMBULATORY_CARE_PROVIDER_SITE_OTHER): Payer: Medicaid Other | Admitting: Family Medicine

## 2014-09-07 ENCOUNTER — Encounter: Payer: Self-pay | Admitting: Family Medicine

## 2014-09-07 VITALS — BP 136/60 | HR 83 | Temp 98.3°F | Ht 62.0 in | Wt 155.0 lb

## 2014-09-07 DIAGNOSIS — M6740 Ganglion, unspecified site: Secondary | ICD-10-CM

## 2014-09-07 DIAGNOSIS — M67432 Ganglion, left wrist: Secondary | ICD-10-CM | POA: Diagnosis present

## 2014-09-07 DIAGNOSIS — N920 Excessive and frequent menstruation with regular cycle: Secondary | ICD-10-CM | POA: Diagnosis not present

## 2014-09-07 MED ORDER — TRAMADOL HCL 50 MG PO TABS: 50.0000 mg | ORAL_TABLET | Freq: Three times a day (TID) | ORAL | Status: DC | PRN

## 2014-09-07 NOTE — Assessment & Plan Note (Signed)
Resolved. Can continue to use ibuprofen 600 mg PRN for relief if recurs.  If not improved, return to clinic.

## 2014-09-07 NOTE — Patient Instructions (Addendum)
Take the tramadol 1-2 pills every 8 hours for pain in her arm or hand.  [facility] will [facility]. I'll make sure that they use an interpreter to set up the appointment.  If you have not heard anything please call our office in about 2 weeks.  It was good to see you again today.

## 2014-09-07 NOTE — Progress Notes (Addendum)
Subjective:   Interpreter used today.  Destiny Walker from [employer].   Destiny Walker is a 39 y.o. female who [facility] pain:  1ganglion cyst: She was referred to [facility] [place] last appt but was never called. She was declined to be seen at hand surgery [place].  She has had worsening pain at spot of cyst as well as rest of Left wrist and thumb.  Worse with movement, better with rest.  States at times pain extends up her arm but this is not usual.  Cyst swells and resolves in size.    #2. Menorrhagia: As noted in prior office visits patient does not desire to go contraception. We have been trying to treat with ibuprofen and extra strength.  States she has been having regular periods every 28 days or so since last visit.    ROS as above per HPI, otherwise neg.    The following portions of the patient's history were reviewed and updated as appropriate: allergies, current medications, past medical history, family and social history, and problem list. Patient is a nonsmoker.    PMH reviewed.  No past medical history on file. No past surgical history on file.  Medications reviewed. Current Outpatient Prescriptions  Medication Sig Dispense Refill  . benzocaine (ORAJEL) 10 % mucosal gel Use as directed 1 application in the mouth or throat as needed for mouth pain.    Marland Kitchen. HYDROcodone-acetaminophen (NORCO/VICODIN) 5-325 MG per tablet Take 1 tablet by mouth every 4 (four) hours as needed. 12 tablet 0  . ibuprofen (ADVIL,MOTRIN) 600 MG tablet Take 1 tablet (600 mg total) by mouth every 8 (eight) hours as needed. 45 tablet 1  . norgestimate-ethinyl estradiol (SPRINTEC 28) 0.25-35 MG-MCG tablet Take 1 tablet by mouth daily. 1 Package 11  . ondansetron (ZOFRAN) 4 MG tablet Take 1 tablet (4 mg total) by mouth every 6 (six) hours. 12 tablet 0   No current facility-administered medications for this visit.     Objective:   Physical Exam BP 136/60 mmHg   Pulse 83  Temp(Src) 98.3 F (36.8 C) (Oral)  Ht 5\' 2"  (1.575 m)  Wt 155 lb (70.308 kg)  BMI 28.34 kg/m2 Gen:  Alert, cooperative patient who appears stated age in no acute distress.  Vital signs reviewed. HEENT: EOMI,  MMM Left hand:  1 cm ganglion cyst located dorsum of left wrist.  TTP here.  No surrounding erythema.   MSK:  Able to oppose thumb easily.  Strength LUE 5/5.  Able to fully move arm in all planes actively without pain.    No results found for this or any previous visit (from the past 72 hour(s)).

## 2014-09-07 NOTE — Assessment & Plan Note (Signed)
She again desires definitive treatment.   Plan to treat symptomatically with Tramadol for pain relief. Refer to [facility] for further evaluation and removal as they accept Medicaid patients.

## 2014-12-04 ENCOUNTER — Ambulatory Visit: Payer: Medicaid Other | Admitting: Family Medicine

## 2015-05-18 ENCOUNTER — Encounter (HOSPITAL_COMMUNITY): Payer: Self-pay | Admitting: *Deleted

## 2015-05-18 ENCOUNTER — Emergency Department (HOSPITAL_COMMUNITY)
Admission: EM | Admit: 2015-05-18 | Discharge: 2015-05-18 | Disposition: A | Discharge status: Home / Self Care | Payer: No Typology Code available for payment source | Attending: Emergency Medicine | Admitting: Emergency Medicine

## 2015-05-18 DIAGNOSIS — X58XXXA Exposure to other specified factors, initial encounter: Secondary | ICD-10-CM | POA: Insufficient documentation

## 2015-05-18 DIAGNOSIS — Y9389 Activity, other specified: Secondary | ICD-10-CM | POA: Insufficient documentation

## 2015-05-18 DIAGNOSIS — S39012A Strain of muscle, fascia and tendon of lower back, initial encounter: Secondary | ICD-10-CM | POA: Insufficient documentation

## 2015-05-18 DIAGNOSIS — Y9289 Other specified places as the place of occurrence of the external cause: Secondary | ICD-10-CM | POA: Insufficient documentation

## 2015-05-18 DIAGNOSIS — Y998 Other external cause status: Secondary | ICD-10-CM | POA: Insufficient documentation

## 2015-05-18 DIAGNOSIS — Z3202 Encounter for pregnancy test, result negative: Secondary | ICD-10-CM | POA: Insufficient documentation

## 2015-05-18 LAB — URINE MICROSCOPIC-ADD ON

## 2015-05-18 LAB — COMPREHENSIVE METABOLIC PANEL
ALT: 114 U/L — ABNORMAL HIGH (ref 14–54)
AST: 72 U/L — ABNORMAL HIGH (ref 15–41)
Albumin: 3.8 g/dL (ref 3.5–5.0)
Alkaline Phosphatase: 90 U/L (ref 38–126)
Anion gap: 11 (ref 5–15)
BUN: 6 mg/dL (ref 6–20)
CO2: 22 mmol/L (ref 22–32)
Calcium: 9.1 mg/dL (ref 8.9–10.3)
Chloride: 106 mmol/L (ref 101–111)
Creatinine, Ser: 0.61 mg/dL (ref 0.44–1.00)
GFR calc Af Amer: >60 mL/min (ref >60)
GFR calc non Af Amer: >60 mL/min (ref >60)
Glucose, Bld: 130 mg/dL — ABNORMAL HIGH (ref 65–99)
Potassium: 3.6 mmol/L (ref 3.5–5.1)
Sodium: 139 mmol/L (ref 135–145)
Total Bilirubin: 0.4 mg/dL (ref 0.3–1.2)
Total Protein: 7.2 g/dL (ref 6.5–8.1)

## 2015-05-18 LAB — CBC
HCT: 38.1 % (ref 36.0–46.0)
Hemoglobin: 12.9 g/dL (ref 12.0–15.0)
MCH: 27.2 pg (ref 26.0–34.0)
MCHC: 33.9 g/dL (ref 30.0–36.0)
MCV: 80.2 fL (ref 78.0–100.0)
Platelets: 216 10*3/uL (ref 150–400)
RBC: 4.75 MIL/uL (ref 3.87–5.11)
RDW: 13.1 % (ref 11.5–15.5)
WBC: 6.6 10*3/uL (ref 4.0–10.5)

## 2015-05-18 LAB — URINALYSIS, ROUTINE W REFLEX MICROSCOPIC
Bilirubin Urine: NEGATIVE
GLUCOSE, UA: NEGATIVE mg/dL
Ketones, ur: NEGATIVE mg/dL
Leukocytes, UA: NEGATIVE
Nitrite: NEGATIVE
PH: 5.5 (ref 5.0–8.0)
PROTEIN: NEGATIVE mg/dL
SPECIFIC GRAVITY, URINE: 1.015 (ref 1.005–1.030)

## 2015-05-18 LAB — LIPASE, BLOOD: Lipase: 27 U/L (ref 11–51)

## 2015-05-18 LAB — POC URINE PREG, ED: Preg Test, Ur: NEGATIVE

## 2015-05-18 MED ORDER — KETOROLAC TROMETHAMINE 60 MG/2ML IM SOLN
60.0000 mg | Freq: Once | INTRAMUSCULAR | Status: AC
Start: 2015-05-18 — End: 2015-05-18
  Administered 2015-05-18: 60 mg via INTRAMUSCULAR
  Filled 2015-05-18: qty 2

## 2015-05-18 MED ORDER — CYCLOBENZAPRINE HCL 10 MG PO TABS: 10.0000 mg | ORAL_TABLET | Freq: Two times a day (BID) | ORAL | Status: DC | PRN

## 2015-05-18 MED ORDER — IBUPROFEN 800 MG PO TABS
800.0000 mg | ORAL_TABLET | Freq: Three times a day (TID) | ORAL | Status: DC
Start: 2015-05-18 — End: 2016-05-26

## 2015-05-18 NOTE — ED Provider Notes (Signed)
CSN: [id number]     Arrival date & time 05/18/15  0152 History   First MD Initiated Contact with Patient 05/18/15 0505     Chief Complaint  Patient presents with  . Flank Pain     (Consider location/radiation/quality/duration/timing/severity/associated sxs/prior Treatment) HPI  The patient is a 40 year old female, she has no significant past medical history. She reports that 3 days ago while she was bending over trying to pick something up off the ground she felt acute onset of pain in her mid lower back, this does not radiate down her legs and is not associated with numbness weakness fevers chills or urinary problems, history of cancer. It seems to get better when she lays down, worse when she sits up or tries to walk, no history of back injury or back pain or back surgery. Translator phone utilized to obtain the history and physical.  History reviewed. No pertinent past medical history. History reviewed. No pertinent past surgical history. No family history on file. Social History  Substance Use Topics  . Smoking status: Never Smoker   . Smokeless tobacco: None  . Alcohol Use: No   OB History    No data available     Review of Systems  All other systems reviewed and are negative.     Allergies  Review of patient's allergies indicates no known allergies.  Home Medications   Prior to Admission medications   Medication Sig Start Date End Date Taking? Authorizing Provider  cyclobenzaprine (FLEXERIL) 10 MG tablet Take 1 tablet (10 mg total) by mouth 2 (two) times daily as needed for muscle spasms. 05/18/15   Eber Hong, MD  ibuprofen (ADVIL,MOTRIN) 800 MG tablet Take 1 tablet (800 mg total) by mouth 3 (three) times daily. 05/18/15   Eber Hong, MD   BP 144/87 mmHg  Pulse 89  Temp(Src) 98.2 F (36.8 C) (Oral)  Resp 18  SpO2 98% Physical Exam  Constitutional: She appears well-developed and well-nourished. No distress.  HENT:  Mouth/Throat: Oropharynx is clear and  moist. No oropharyngeal exudate.  Eyes: Conjunctivae are normal. Right eye exhibits no discharge. Left eye exhibits no discharge. No scleral icterus.  Neck: Neck supple.  Pulmonary/Chest: Effort normal. No respiratory distress.  Abdominal: Soft. Bowel sounds are normal. She exhibits no distension and no mass. There is no tenderness.  Musculoskeletal: Normal range of motion. She exhibits tenderness ( Focal tenderness over the lumbar and paralumbar muscles). She exhibits no edema.  Neurological: She is alert. Coordination normal.  Skin: Skin is warm and dry. No rash noted. No erythema.  Psychiatric: She has a normal mood and affect. Her behavior is normal.  Nursing note and vitals reviewed.   ED Course  Procedures (including critical care time) Labs Review Labs Reviewed  COMPREHENSIVE METABOLIC PANEL - Abnormal; Notable for the following:    Glucose, Bld 130 (*)    AST 72 (*)    ALT 114 (*)    All other components within normal limits  URINALYSIS, ROUTINE W REFLEX MICROSCOPIC (NOT AT [facility]) - Abnormal; Notable for the following:    APPearance CLOUDY (*)    Hgb urine dipstick LARGE (*)    All other components within normal limits  URINE MICROSCOPIC-ADD ON - Abnormal; Notable for the following:    Squamous Epithelial / LPF 6-30 (*)    Bacteria, UA FEW (*)    All other components within normal limits  LIPASE, BLOOD  CBC  POC URINE PREG, ED    Imaging Review No results  found. I have personally reviewed and evaluated these images and lab results as part of my medical decision-making.    MDM   Final diagnoses:  Lumbar strain, initial encounter    The patient has normal vital signs other than mild hypertension, she has no signs of fracture, no signs of neurologic dysfunction, doubt herniated disc, doubt pathology, we'll treat with anti-inflammatories, Flexeril, hot compresses and close follow-up, the patient is in agreement. She was given all of her discharge instructions through  the translator phone and expressed her understanding to the verbal and written discharge instructions.  Meds given in ED:  Medications  ketorolac (TORADOL) injection 60 mg (not administered)    New Prescriptions   CYCLOBENZAPRINE (FLEXERIL) 10 MG TABLET    Take 1 tablet (10 mg total) by mouth 2 (two) times daily as needed for muscle spasms.   IBUPROFEN (ADVIL,MOTRIN) 800 MG TABLET    Take 1 tablet (800 mg total) by mouth 3 (three) times daily.       Eber HongBrian Cruise Baumgardner, MD 05/18/15 [phone]

## 2015-05-18 NOTE — Discharge Instructions (Signed)

## 2015-05-18 NOTE — ED Notes (Signed)
Pt left with all her belongings and ambulated out of the treatment area.  

## 2015-05-18 NOTE — ED Notes (Signed)
The pt is c/o  Flank pain for 3 days.  lmp unknown  There is a language gap

## 2015-08-06 ENCOUNTER — Emergency Department (HOSPITAL_COMMUNITY)
Admission: EM | Admit: 2015-08-06 | Discharge: 2015-08-06 | Disposition: A | Discharge status: Home / Self Care | Payer: No Typology Code available for payment source | Attending: Emergency Medicine | Admitting: Emergency Medicine

## 2015-08-06 ENCOUNTER — Encounter (HOSPITAL_COMMUNITY): Payer: Self-pay | Admitting: Emergency Medicine

## 2015-08-06 DIAGNOSIS — N939 Abnormal uterine and vaginal bleeding, unspecified: Secondary | ICD-10-CM | POA: Insufficient documentation

## 2015-08-06 DIAGNOSIS — E876 Hypokalemia: Secondary | ICD-10-CM | POA: Insufficient documentation

## 2015-08-06 DIAGNOSIS — Z3202 Encounter for pregnancy test, result negative: Secondary | ICD-10-CM | POA: Insufficient documentation

## 2015-08-06 DIAGNOSIS — Z791 Long term (current) use of non-steroidal anti-inflammatories (NSAID): Secondary | ICD-10-CM | POA: Insufficient documentation

## 2015-08-06 LAB — I-STAT CHEM 8, ED
BUN: 7 mg/dL (ref 6–20)
CHLORIDE: 106 mmol/L (ref 101–111)
CREATININE: 0.6 mg/dL (ref 0.44–1.00)
Calcium, Ion: 1.15 mmol/L (ref 1.12–1.23)
Glucose, Bld: 160 mg/dL — ABNORMAL HIGH (ref 65–99)
HEMATOCRIT: 37 % (ref 36.0–46.0)
Hemoglobin: 12.6 g/dL (ref 12.0–15.0)
POTASSIUM: 3.4 mmol/L — AB (ref 3.5–5.1)
Sodium: 143 mmol/L (ref 135–145)
TCO2: 21 mmol/L (ref 0–100)

## 2015-08-06 LAB — I-STAT BETA HCG BLOOD, ED (MC, WL, AP ONLY): I-stat hCG, quantitative: <5 m[IU]/mL (ref <5)

## 2015-08-06 LAB — WET PREP, GENITAL
CLUE CELLS WET PREP: NONE SEEN
SPERM: NONE SEEN
Trich, Wet Prep: NONE SEEN
WBC WET PREP: NONE SEEN
Yeast Wet Prep HPF POC: NONE SEEN

## 2015-08-06 LAB — GC/CHLAMYDIA PROBE AMP (~~LOC~~) NOT AT ARMC
CHLAMYDIA, DNA PROBE: NEGATIVE
NEISSERIA GONORRHEA: NEGATIVE

## 2015-08-06 MED ORDER — POTASSIUM CHLORIDE ER 20 MEQ PO TBCR: 40.0000 meq | EXTENDED_RELEASE_TABLET | Freq: Every day | ORAL | Status: DC

## 2015-08-06 MED ORDER — LIDOCAINE HCL (PF) 1 % IJ SOLN
INTRAMUSCULAR | Status: AC
  Filled 2015-08-06: qty 5

## 2015-08-06 MED ORDER — IBUPROFEN 400 MG PO TABS
600.0000 mg | ORAL_TABLET | Freq: Once | ORAL | Status: AC
  Administered 2015-08-06: 600 mg via ORAL
  Filled 2015-08-06: qty 1

## 2015-08-06 NOTE — ED Provider Notes (Signed)
CSN: [id number]     Arrival date & time 08/06/15  [id number] History   First MD Initiated Contact with Patient 08/06/15 0600     Chief Complaint  Patient presents with  . Vaginal Bleeding    HPI  Destiny Walker is an 40 y.o. female with no significant PMH who presents to the ED for evaluation of heavy vaginal bleeding. She speaks mainly arabic and history is obtained via a Nurse, learning disabilitytranslator. She states she has had heavy vaginal bleeding for the past week. Reports using 10 pads daily. She states she has associated diffuse abdominal cramping but denies n/v/d. States she has regular periods though they are typically not this heavy. She had previously been on Depo but has not had a Depo injection since February 2016. She denies urinary symptoms. She has had irregular periods in the past but typically while she was still receiving Depo injections. Denies fever, chills, lightheadedness, dizziness.   History reviewed. No pertinent past medical history. History reviewed. No pertinent past surgical history. History reviewed. No pertinent family history. Social History  Substance Use Topics  . Smoking status: Never Smoker   . Smokeless tobacco: None  . Alcohol Use: No   OB History    No data available     Review of Systems  All other systems reviewed and are negative.     Allergies  Review of patient's allergies indicates no known allergies.  Home Medications   Prior to Admission medications   Medication Sig Start Date End Date Taking? Authorizing Provider  cyclobenzaprine (FLEXERIL) 10 MG tablet Take 1 tablet (10 mg total) by mouth 2 (two) times daily as needed for muscle spasms. 05/18/15   Eber HongBrian Miller, MD  ibuprofen (ADVIL,MOTRIN) 800 MG tablet Take 1 tablet (800 mg total) by mouth 3 (three) times daily. 05/18/15   Eber HongBrian Miller, MD   BP 149/83 mmHg  Pulse 90  Temp(Src) 98.7 F (37.1 C) (Oral)  SpO2 99% Physical Exam  Constitutional: She is oriented to person, place, and time.  HENT:  Right  Ear: External ear normal.  Left Ear: External ear normal.  Nose: Nose normal.  Mouth/Throat: Oropharynx is clear and moist. No oropharyngeal exudate.  Eyes: Conjunctivae and EOM are normal. Pupils are equal, round, and reactive to light.  Neck: Normal range of motion. Neck supple.  Cardiovascular: Normal rate, regular rhythm, normal heart sounds and intact distal pulses.   Pulmonary/Chest: Effort normal and breath sounds normal. No respiratory distress. She has no wheezes. She exhibits no tenderness.  Abdominal: Soft. Bowel sounds are normal. She exhibits no distension. There is no tenderness. There is no rebound and no guarding.  Genitourinary:  External genitalia NL There is red-brown blood in vault, not heavy. No masses or lesions. Cervix NL and non friable. No CMT or adnexal tenderness  Musculoskeletal: She exhibits no edema.  Neurological: She is alert and oriented to person, place, and time. No cranial nerve deficit.  Skin: Skin is warm and dry.  Psychiatric: She has a normal mood and affect.  Nursing note and vitals reviewed.   ED Course  Procedures (including critical care time) Labs Review Labs Reviewed  I-STAT CHEM 8, ED - Abnormal; Notable for the following:    Potassium 3.4 (*)    Glucose, Bld 160 (*)    All other components within normal limits  WET PREP, GENITAL  I-STAT BETA HCG BLOOD, ED ([facility], [facility], [facility] ONLY)  I-STAT BETA HCG BLOOD, ED ([facility], [facility], [facility] ONLY)  GC/CHLAMYDIA PROBE AMP ([facility]) NOT AT [facility]    Imaging Review No results found. I have personally reviewed and evaluated these images and lab results as part of my medical decision-making.   EKG Interpretation None      MDM   Final diagnoses:  Abnormal uterine bleeding  Hypokalemia    VS unremarkable. No tachycardia or hypotension. Pt nontoxic. Exam with some blood in the vaginal vault but otherwise unremarkable. Pt is hemodynamically stable and her labs reveal no anemia or abnormalities on her wet  prep. Hcg neg. Mild hypokalemia which we will correct. Encouraged close outpatient follow up with [facility] or her PCP. Ibuprofen as needed for dysmenorrhea. ER return precautions given.    Carlene Coria, PA-C 08/06/15 0703  Gilda Crease, MD 08/06/15 [phone]

## 2015-08-06 NOTE — ED Notes (Addendum)
Pt presents with heavy vaginal bleeding x 7 days; pt reports abd cramping and noting blood clots; states different from normal period; pt denies having this problem in the past; pt denies lightheadedness/dizziness; states she gets depo shot to prevent pregnancy every three months; pt speaks arabic and pacific interpreters used for this assessment

## 2015-08-06 NOTE — Discharge Instructions (Signed)
You were seen in the emergency room today for evaluation of vaginal bleeding. Your bloodwork and exam were unremarkable and reassuring. Your bloodwork did show a slightly low potassium so I will send you home with a prescription for a few days of potassium supplements. Please call your primary care provider or [facility] to schedule a follow up appointment as soon as possible. Return to the emergency room for new or worsening symptoms.    Dysfunctional Uterine Bleeding Dysfunctional uterine bleeding is abnormal bleeding from the uterus. Dysfunctional uterine bleeding includes:  A period that comes earlier or later than usual.  A period that is lighter, heavier, or has blood clots.  Bleeding between periods.  Skipping one or more periods.  Bleeding after sexual intercourse.  Bleeding after menopause. HOME CARE INSTRUCTIONS  Pay attention to any changes in your symptoms. Follow these instructions to help with your condition: Eating  Eat well-balanced meals. Include foods that are high in iron, such as liver, meat, shellfish, green leafy vegetables, and eggs.  If you become constipated:  Drink plenty of water.  Eat fruits and vegetables that are high in water and fiber, such as spinach, carrots, raspberries, apples, and mango. Medicines  Take over-the-counter and prescription medicines only as told by your health care provider.  Do not change medicines without talking with your health care provider.  Aspirin or medicines that contain aspirin may make the bleeding worse. Do not take those medicines:  During the week before your period.  During your period.  If you were prescribed iron pills, take them as told by your health care provider. Iron pills help to replace iron that your body loses because of this condition. Activity  If you need to change your sanitary pad or tampon more than one time every 2 hours:  Lie in bed with your feet raised  (elevated).  Place a cold pack on your lower abdomen.  Rest as much as possible until the bleeding stops or slows down.  Do not try to lose weight until the bleeding has stopped and your blood iron level is back to normal. Other Instructions  For two months, write down:  When your period starts.  When your period ends.  When any abnormal bleeding occurs.  What problems you notice.  Keep all follow up visits as told by your health care provider. This is important. SEEK MEDICAL CARE IF:  You get light-headed or weak.  You have nausea and vomiting.  You cannot eat or drink without vomiting.  You feel dizzy or have diarrhea while you are taking medicines.  You are taking birth control pills or hormones, and you want to change them or stop taking them. SEEK IMMEDIATE MEDICAL CARE IF:  You develop a fever or chills.  You need to change your sanitary pad or tampon more than one time per hour.  Your bleeding becomes heavier, or your flow contains clots more often.  You develop pain in your abdomen.  You lose consciousness.  You develop a rash.   This information is not intended to replace advice given to you by your health care provider. Make sure you discuss any questions you have with your health care provider.   Document Released: 02/21/2000 Document Revised: 11/14/2014 Document Reviewed: 05/21/2014 Elsevier Interactive Patient Education [employer]2016 Elsevier Inc.

## 2015-08-09 ENCOUNTER — Telehealth: Payer: Self-pay
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
## 2015-08-14 NOTE — Telephone Encounter (Signed)
No answer, per chart review pt never seen here.
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
## 2016-05-26 ENCOUNTER — Ambulatory Visit (INDEPENDENT_AMBULATORY_CARE_PROVIDER_SITE_OTHER): Payer: Self-pay | Admitting: Family Medicine

## 2016-05-26 ENCOUNTER — Encounter: Payer: Self-pay | Admitting: Family Medicine

## 2016-05-26 DIAGNOSIS — N920 Excessive and frequent menstruation with regular cycle: Secondary | ICD-10-CM

## 2016-05-26 DIAGNOSIS — M79604 Pain in right leg: Secondary | ICD-10-CM

## 2016-05-26 MED ORDER — IBUPROFEN 800 MG PO TABS: 800.0000 mg | ORAL_TABLET | Freq: Three times a day (TID) | ORAL | 0 refills | Status: DC

## 2016-05-26 MED ORDER — NORGESTIMATE-ETH ESTRADIOL 0.25-35 MG-MCG PO TABS: 1.0000 | ORAL_TABLET | Freq: Every day | ORAL | 11 refills | Status: DC

## 2016-05-26 NOTE — Patient Instructions (Addendum)
Take the Ibuprofen every 8 hours or so for pain if you have it.  Start the Sprintec the day your next period stops.  Come back and see me in 2-3 months so we know that you're doing okay.  It was good to see you today.

## 2016-05-26 NOTE — Assessment & Plan Note (Signed)
Musculoskeletal pain Triggered by prolonged standing or walking. No evidence of claudication or peripheral arterial disease. Treating with ibuprofen.

## 2016-05-26 NOTE — Assessment & Plan Note (Signed)
Starting Sprintec. See instructions below. Explained about using this with interpreter. Follow-up in 2-3 months to assess for improvement. Also we are treating her pain with ibuprofen and this will also likely help some of her menorrhagia symptoms.

## 2016-05-26 NOTE — Progress Notes (Signed)
Subjective:   [employer] interpreter Destiny [phone]#[id number]  [name] is a 41 y.o. female [facility]:  1.  Irregular bleeding:  Present for about 1 year.  In the past she received Depo x 6 months.  Since that time her menstruation has been "off."  By this, she means that it is prolonged bleeding.  Comes every month, but lasts 7 - 14 days.   In the past, this has been treated with 600 mg of ibuprofen And resolved. However she has not taken this for about a year.  She desires oral contraceptive to regulate.  No abdominal pain.  #2. Right leg pain: Mild thigh pain with prolonged walking. Never calf pain. Also occasional pain with prolonged standing. This is been a long-standing issue for her in the past. Describes as "mild aching". Resolved the past with ibuprofen. No back pain. No pain in left leg. No numbness and tingling. No weakness. No injury to leg.  ROS as above per HPI.   The following portions of the patient's history were reviewed and updated as appropriate: allergies, current medications, past medical history, family and social history, and problem list. Patient is a nonsmoker.    PMH reviewed.  No past medical history on file. No past surgical history on file.  Medications reviewed. Current Outpatient Prescriptions  Medication Sig Dispense Refill  . cyclobenzaprine (FLEXERIL) 10 MG tablet Take 1 tablet (10 mg total) by mouth 2 (two) times daily as needed for muscle spasms. 20 tablet 0  . ibuprofen (ADVIL,MOTRIN) 800 MG tablet Take 1 tablet (800 mg total) by mouth 3 (three) times daily. 21 tablet 0  . potassium chloride 20 MEQ TBCR Take 40 mEq by mouth daily. 6 tablet 0   No current facility-administered medications for this visit.      Objective:   Physical Exam BP 122/70   Pulse 90   Temp 98.4 F (36.9 C) (Oral)   Ht 5\' 2"  (1.575 m)   Wt 152 lb 6.4 oz (69.1 kg)   LMP 05/07/2016 (Approximate) Comment: lasted 15 days  SpO2 97%   BMI  27.87 kg/m  Gen:  Alert, cooperative patient who appears stated age in no acute distress.  Vital signs reviewed. HEENT: EOMI,  MMM Cardiac:  Regular rate and rhythm  Pulm:  Clear to auscultation bilaterally with good air movement.  No wheezes or rales noted.   Exts: Non edematous BL  LE, warm and well perfused.  MSK: Strength 5 out of 5 bilateral lower extremity. Sensation intact bilateral lower extremities. Cap refill good.  No results found for this or any previous visit (from the past 72 hour(s)).

## 2016-06-10 ENCOUNTER — Ambulatory Visit: Payer: Medicaid Other | Admitting: Internal Medicine

## 2018-02-22 ENCOUNTER — Other Ambulatory Visit: Payer: Self-pay

## 2018-02-22 ENCOUNTER — Ambulatory Visit (INDEPENDENT_AMBULATORY_CARE_PROVIDER_SITE_OTHER): Payer: Self-pay | Admitting: Family Medicine

## 2018-02-22 ENCOUNTER — Encounter: Payer: Self-pay | Admitting: Family Medicine

## 2018-02-22 ENCOUNTER — Ambulatory Visit (HOSPITAL_COMMUNITY)
Admission: RE | Admit: 2018-02-22 | Discharge: 2018-02-22 | Disposition: A | Discharge status: Home / Self Care | Payer: Medicaid Other | Source: Ambulatory Visit | Attending: Family Medicine | Admitting: Family Medicine

## 2018-02-22 VITALS — BP 126/68 | HR 83 | Temp 98.4°F | Ht 62.0 in | Wt 155.8 lb

## 2018-02-22 DIAGNOSIS — R059 Cough, unspecified: Secondary | ICD-10-CM | POA: Insufficient documentation

## 2018-02-22 DIAGNOSIS — M62830 Muscle spasm of back: Secondary | ICD-10-CM

## 2018-02-22 DIAGNOSIS — R05 Cough: Secondary | ICD-10-CM

## 2018-02-22 DIAGNOSIS — H547 Unspecified visual loss: Secondary | ICD-10-CM

## 2018-02-22 MED ORDER — ALBUTEROL SULFATE HFA 108 (90 BASE) MCG/ACT IN AERS: 2.0000 | INHALATION_SPRAY | Freq: Four times a day (QID) | RESPIRATORY_TRACT | 0 refills | Status: DC | PRN

## 2018-02-22 MED ORDER — IBUPROFEN 800 MG PO TABS: 800.0000 mg | ORAL_TABLET | Freq: Three times a day (TID) | ORAL | 0 refills | Status: DC

## 2018-02-22 NOTE — Assessment & Plan Note (Signed)
Outputs. -Attempt treating glasses over-the-counter. -Follow-up in about a month if no improvement. -No indication for full ophthalmologic exam currently.  Interest would not pay for optometrist exam.

## 2018-02-22 NOTE — Progress Notes (Signed)
Subjective:   Language Resources in-person interpreter used for entire visit:  Destiny Walker is a 42 y.o. female [facility] and cough:  1.  Vision issues: Difficulty seeing up close for the past several years.  She states that her far distance vision is good.  No is better than the other.  She has not tried reading glasses.  She has never had an eye exam.  No injuries to her eyes.  No history of halos or difficulty seeing at nighttime.  2.  Cough: Present for the past several months.  Worse since the weather became colder outside.  Coughing is often triggered by going outside in the cold air..  She had Sunday so she is "fine" and other days where she has dry hacking cough.  She has no history of tobacco use.  No lower extremity edema.  No chest pain.  No exertional shortness of breath.  Several years ago she had similar symptoms and was treated adequately with an albuterol inhaler.  No fevers or chills.  No hemoptysis.  NO GERD sx's  3.  Left shoulder pain: Present for the past several months.  No injuries.  No falls.  No pain in her arm, neck.  States this pain is "outside" of her chest and not deep inside.  Difficult when she lays on her left side.  No numbness or tingling in her arm or hand.   ROS as above per HPI.   The following portions of the patient's history were reviewed and updated as appropriate: allergies, current medications, past medical history, family and social history, and problem list. Patient is a nonsmoker.    PMH reviewed.  No past medical history on file. No past surgical history on file.  Medications reviewed. Current Outpatient Medications  Medication Sig Dispense Refill  . cyclobenzaprine (FLEXERIL) 10 MG tablet Take 1 tablet (10 mg total) by mouth 2 (two) times daily as needed for muscle spasms. 20 tablet 0  . ibuprofen (ADVIL,MOTRIN) 800 MG tablet Take 1 tablet (800 mg total) by mouth 3 (three) times daily. 21 tablet 0  .  norgestimate-ethinyl estradiol (ORTHO-CYCLEN,SPRINTEC,PREVIFEM) 0.25-35 MG-MCG tablet Take 1 tablet by mouth daily. 1 Package 11   No current facility-administered medications for this visit.      Objective:   Physical Exam BP 126/68   Pulse 83   Temp 98.4 F (36.9 C) (Oral)   Ht 5\' 2"  (1.575 m)   Wt 155 lb 12.8 oz (70.7 kg)   LMP 02/02/2018 (Approximate)   SpO2 96%   BMI 28.50 kg/m  Gen:  Alert, cooperative patient who appears stated age in no acute distress.  Vital signs reviewed. HEENT: EOMI, pupils equal reactive to light.  No conjunctival injection or redness.  MMM Cardiac:  Regular rate and rhythm  Pulm:  Clear to auscultation bilaterally with good air movement.  No wheezes or rales noted.   MSK: Tender palpation along left clavicle.  Tenderness worse at the manubrium.  Also tender to palpation along the left trapezius muscle in the back.  She has palpable spasm noted here.  Full external and internal range of motion without any pain.  Passive and active range of motion without any pain throughout on the left side.  Neurovascular intact on the left. Exts: Non edematous BL  LE, warm and well perfused.   No results found for this or any previous visit (from the past 72 hour(s)).

## 2018-02-22 NOTE — Assessment & Plan Note (Signed)
Becoming more chronic.  She has history of this being treated in the past with inhaler.  She would benefit from PFTs.  Obtaining a chest x-ray and labs today.  She is not on ACE inhibitor.  No symptoms of GERD.  Follow-up in about a month to assess improvement and continue with inhaler.  Refer for PFTs at that time.

## 2018-02-22 NOTE — Patient Instructions (Signed)
It was good to see you again today.  Go to the pharmacy: You can find reading glasses there that should help with your vision.  You can also find a cream called BenGay.  These are both over-the-counter.    Take the ibuprofen every 6-8 hours as needed for pain relief.  We are doing labs today.  You can go to the hospital for your chest x-ray.  You do not need an appointment to get your eye then.  Use the inhaler every 6 hours or so if needed for cough or any trouble breathing.  -------------------------------------------------------------------------  My doctor wants me to have a chest x-ray.  Please help me find radiology department.  I need an Arabic interpreter.

## 2018-02-22 NOTE — Assessment & Plan Note (Signed)
Left trapezius.  Tender to palpation here.  Believe this is contributing to the left clavicular pain as well.  No bruising or injuries noted.  Treat with over-the-counter BenGay plus ibuprofen for pain relief.  Follow-up in 1 month to assess for improvement.

## 2018-02-23 LAB — CBC WITH DIFFERENTIAL/PLATELET
Basophils Absolute: 0 10*3/uL (ref 0.0–0.2)
Basos: 0 %
EOS (ABSOLUTE): 0.1 10*3/uL (ref 0.0–0.4)
EOS: 2 %
HEMATOCRIT: 37.9 % (ref 34.0–46.6)
Hemoglobin: 12.8 g/dL (ref 11.1–15.9)
Immature Grans (Abs): 0 10*3/uL (ref 0.0–0.1)
Immature Granulocytes: 0 %
LYMPHS ABS: 2 10*3/uL (ref 0.7–3.1)
Lymphs: 44 %
MCH: 26.9 pg (ref 26.6–33.0)
MCHC: 33.8 g/dL (ref 31.5–35.7)
MCV: 80 fL (ref 79–97)
MONOS ABS: 0.3 10*3/uL (ref 0.1–0.9)
Monocytes: 7 %
NEUTROS ABS: 2.2 10*3/uL (ref 1.4–7.0)
Neutrophils: 47 %
Platelets: 265 10*3/uL (ref 150–450)
RBC: 4.76 x10E6/uL (ref 3.77–5.28)
RDW: 13.8 % (ref 12.3–15.4)
WBC: 4.6 10*3/uL (ref 3.4–10.8)

## 2018-02-23 LAB — COMPREHENSIVE METABOLIC PANEL
ALT: 45 IU/L — AB (ref 0–32)
AST: 38 IU/L (ref 0–40)
Albumin/Globulin Ratio: 1.5 (ref 1.2–2.2)
Albumin: 4.1 g/dL (ref 3.5–5.5)
Alkaline Phosphatase: 87 IU/L (ref 39–117)
BUN/Creatinine Ratio: 16 (ref 9–23)
BUN: 12 mg/dL (ref 6–24)
CHLORIDE: 102 mmol/L (ref 96–106)
CO2: 22 mmol/L (ref 20–29)
Calcium: 9.7 mg/dL (ref 8.7–10.2)
Creatinine, Ser: 0.74 mg/dL (ref 0.57–1.00)
GFR, EST AFRICAN AMERICAN: 115 mL/min/{1.73_m2} (ref >59)
GFR, EST NON AFRICAN AMERICAN: 100 mL/min/{1.73_m2} (ref >59)
GLUCOSE: 152 mg/dL — AB (ref 65–99)
Globulin, Total: 2.8 g/dL (ref 1.5–4.5)
Potassium: 4.2 mmol/L (ref 3.5–5.2)
Sodium: 141 mmol/L (ref 134–144)
TOTAL PROTEIN: 6.9 g/dL (ref 6.0–8.5)

## 2018-02-23 LAB — LIPID PANEL
CHOL/HDL RATIO: 3.7 ratio (ref 0.0–4.4)
Cholesterol, Total: 146 mg/dL (ref 100–199)
HDL: 39 mg/dL — ABNORMAL LOW (ref >39)
LDL Calculated: 67 mg/dL (ref 0–99)
Triglycerides: 201 mg/dL — ABNORMAL HIGH (ref 0–149)
VLDL Cholesterol Cal: 40 mg/dL (ref 5–40)

## 2018-03-06 ENCOUNTER — Encounter: Payer: Self-pay | Admitting: Family Medicine

## 2018-03-06 ENCOUNTER — Telehealth: Payer: Self-pay | Admitting: Family Medicine

## 2018-03-06 MED ORDER — AZITHROMYCIN 250 MG PO TABS: ORAL_TABLET | ORAL | 0 refills | Status: DC

## 2018-03-06 NOTE — Telephone Encounter (Signed)
Surprisingly, CXR shows concern for infection.  Cough was mild in clinic.  No fevers or chills.  Her white count was completely normal.  Will send in 5 days worth azithromycin and follow-up in clinic to ensure resolution.   White Team, please call patient and let her know that I sent in an antibiotic for her to pick up.  She should follow up and see me after she has finished them to make sure her cough is better.    She will need an Arabic interpreter.  Thanks!

## 2018-03-07 NOTE — Telephone Encounter (Signed)
Called pt using [employer] Pollyann Glen(Alyaa [phone]) left message asking for a return call to our office. If pt calls, please have her speak with a nurse or someone on white team. Sunday SpillersSharon T Saunders, CMA

## 2018-03-11 NOTE — Telephone Encounter (Signed)
Patient returned call. Called back with [employer] # [phone] [place]. Information given. Return appt made. Ples Specter, RN [facility] RN)

## 2018-03-22 ENCOUNTER — Encounter: Payer: Self-pay | Admitting: Family Medicine

## 2018-03-22 ENCOUNTER — Other Ambulatory Visit: Payer: Self-pay

## 2018-03-22 ENCOUNTER — Ambulatory Visit (INDEPENDENT_AMBULATORY_CARE_PROVIDER_SITE_OTHER): Payer: Self-pay | Admitting: Family Medicine

## 2018-03-22 ENCOUNTER — Ambulatory Visit: Payer: Medicaid Other | Admitting: Family Medicine

## 2018-03-22 VITALS — BP 130/80 | HR 76 | Temp 98.3°F | Ht 62.0 in | Wt 158.2 lb

## 2018-03-22 DIAGNOSIS — R05 Cough: Secondary | ICD-10-CM

## 2018-03-22 DIAGNOSIS — R739 Hyperglycemia, unspecified: Secondary | ICD-10-CM

## 2018-03-22 DIAGNOSIS — R059 Cough, unspecified: Secondary | ICD-10-CM

## 2018-03-22 LAB — POCT GLYCOSYLATED HEMOGLOBIN (HGB A1C): HEMOGLOBIN A1C: 6.6 % — AB (ref 4.0–5.6)

## 2018-03-22 MED ORDER — AMOXICILLIN 500 MG PO CAPS
1000.0000 mg | ORAL_CAPSULE | Freq: Three times a day (TID) | ORAL | 0 refills | Status: DC
Start: 2018-03-22 — End: 2018-06-21

## 2018-03-22 NOTE — Progress Notes (Signed)
Subjective:   [employer] interpreter Donnetta Hail used for entire visit: Jenet Pelc is a 43 y.o. female who presents to [facility] today for cough:  1.  Cough: Follow-up from previous visit where she had prolonged cough.  Sent for chest x-ray and concern for infiltrates concerning for pneumonia.  She was started on azithromycin.  Cough is gradually improved.  No fevers or chills but cough is still present.  Not worse any certain time of day.  Not worse at night.  Alternates dry hacking with productive of thick green sputum.  No weight loss.  No hemoptysis.  She was screened for tuberculosis when arriving to [place] and was negative.  Not on an ACE inhibitor.  Food intake does not exacerbate her cough.   ROS as above per HPI.  The following portions of the patient's history were reviewed and updated as appropriate: allergies, current medications, past medical history, family and social history, and problem list. Patient is a nonsmoker.    PMH reviewed.  No past medical history on file. No past surgical history on file.  Medications reviewed. Current Outpatient Medications  Medication Sig Dispense Refill  . albuterol (PROVENTIL HFA;VENTOLIN HFA) 108 (90 Base) MCG/ACT inhaler Inhale 2 puffs into the lungs every 6 (six) hours as needed for wheezing or shortness of breath. 1 Inhaler 0  . azithromycin (ZITHROMAX) 250 MG tablet Take 2 tabs day 1, then 1 tab daily 6 each 0  . ibuprofen (ADVIL,MOTRIN) 800 MG tablet Take 1 tablet (800 mg total) by mouth 3 (three) times daily. 45 tablet 0  . norgestimate-ethinyl estradiol (ORTHO-CYCLEN,SPRINTEC,PREVIFEM) 0.25-35 MG-MCG tablet Take 1 tablet by mouth daily. 1 Package 11   No current facility-administered medications for this visit.      Objective:   Physical Exam BP 130/80   Pulse 76   Temp 98.3 F (36.8 C) (Oral)   Ht 5\' 2"  (1.575 m)   Wt 158 lb 3.2 oz (71.8 kg)   LMP 03/17/2018 (Approximate)   SpO2 96%   BMI 28.94 kg/m  Gen:  Alert, cooperative  patient who appears stated age in no acute distress.  Vital signs reviewed.  Well-appearing. HEENT: EOMI,  MMM Cardiac:  Regular rate and rhythm without murmur auscultated.  Good S1/S2. Pulm:  Clear to auscultation bilaterally.  No focal findings.

## 2018-03-22 NOTE — Patient Instructions (Signed)
Take the Amoxicillin 2 pills three times a day for 5 days total.   Go get a chest xray in 2 weeks.  I will call you with these results.  You do not need an appointment.   We are checking your blood sugar today as well.    It was good to see you today

## 2018-03-24 ENCOUNTER — Encounter: Payer: Self-pay | Admitting: Family Medicine

## 2018-03-24 DIAGNOSIS — R739 Hyperglycemia, unspecified: Secondary | ICD-10-CM | POA: Insufficient documentation

## 2018-03-24 NOTE — Assessment & Plan Note (Signed)
No wheezing on exam today. -As noted in HPI interestingly enough her chest x-ray did show concern for pneumonia with infiltrates. -Treating more appropriately for community-acquired pneumonia with addition of amoxicillin on top of azithromycin.  I did discuss taking both these medicines. -She does have a follow-up chest x-ray in 2 weeks. -Will discuss at the next visit the results of imaging.

## 2018-03-24 NOTE — Assessment & Plan Note (Signed)
New problem.  Noted incidentally on review of her lab checks.  Checking A1c today.  No polyuria polydipsia.

## 2018-06-21 ENCOUNTER — Encounter (HOSPITAL_COMMUNITY): Payer: Self-pay | Admitting: Emergency Medicine

## 2018-06-21 ENCOUNTER — Ambulatory Visit (HOSPITAL_COMMUNITY)
Admission: EM | Admit: 2018-06-21 | Discharge: 2018-06-21 | Disposition: A | Discharge status: Home / Self Care | Payer: Self-pay | Attending: Family Medicine | Admitting: Family Medicine

## 2018-06-21 ENCOUNTER — Other Ambulatory Visit: Payer: Self-pay

## 2018-06-21 ENCOUNTER — Ambulatory Visit (INDEPENDENT_AMBULATORY_CARE_PROVIDER_SITE_OTHER): Payer: Self-pay

## 2018-06-21 DIAGNOSIS — J22 Unspecified acute lower respiratory infection: Secondary | ICD-10-CM

## 2018-06-21 DIAGNOSIS — R05 Cough: Secondary | ICD-10-CM

## 2018-06-21 MED ORDER — ALBUTEROL SULFATE HFA 108 (90 BASE) MCG/ACT IN AERS: 1.0000 | INHALATION_SPRAY | Freq: Four times a day (QID) | RESPIRATORY_TRACT | 0 refills | Status: DC | PRN

## 2018-06-21 MED ORDER — CETIRIZINE HCL 10 MG PO CAPS: 10.0000 mg | ORAL_CAPSULE | Freq: Every day | ORAL | 0 refills | Status: DC

## 2018-06-21 MED ORDER — DOXYCYCLINE HYCLATE 100 MG PO CAPS: 100.0000 mg | ORAL_CAPSULE | Freq: Two times a day (BID) | ORAL | 0 refills | Status: AC

## 2018-06-21 MED ORDER — BENZONATATE 200 MG PO CAPS: 200.0000 mg | ORAL_CAPSULE | Freq: Three times a day (TID) | ORAL | 0 refills | Status: AC | PRN

## 2018-06-21 NOTE — ED Triage Notes (Signed)
Pt c/o cough x4 months.

## 2018-06-21 NOTE — ED Provider Notes (Signed)
[facility]    CSN: [id number] Arrival date & time: 06/21/18  4098     History   Chief Complaint Chief Complaint  Patient presents with  . Appointment  . Cough    HPI Destiny Walker is a 43 y.o. female no contributing past medical history presenting today for evaluation of cough.  Patient has had a cough off and on for the past 4 months.  Denies other symptoms with her cough.  Denies fever, rhinorrhea, sore throat.  Denies shortness of breath or chest pain.  She has tried some over-the-counter day and night cough medicine with minimal relief.  States that she had a similar cough approximately 4 years ago. Denies any recent travel, denies any known exposure to COVID-19.  Has been staying home over the past couple weeks.  Denies history of smoking.  HPI  History reviewed. No pertinent past medical history.  Patient Active Problem List   Diagnosis Date Noted  . Hyperglycemia 03/24/2018  . Poor vision 02/22/2018  . Cough 02/22/2018  . Menorrhagia 06/29/2014  . Back spasm 06/29/2014  . Bilateral headaches 03/14/2014  . Contraception 10/06/2013  . Pelvic pain in female 09/20/2013  . Ganglion cyst 09/20/2013    History reviewed. No pertinent surgical history.  OB History   No obstetric history on file.      Home Medications    Prior to Admission medications   Medication Sig Start Date End Date Taking? Authorizing Provider  albuterol (PROVENTIL HFA;VENTOLIN HFA) 108 (90 Base) MCG/ACT inhaler Inhale 1-2 puffs into the lungs every 6 (six) hours as needed for wheezing or shortness of breath. 06/21/18   Madelynne Lasker C, PA-C  benzonatate (TESSALON) 200 MG capsule Take 1 capsule (200 mg total) by mouth 3 (three) times daily as needed for up to 7 days for cough. 06/21/18 06/28/18  Ariday Brinker C, PA-C  Cetirizine HCl 10 MG CAPS Take 1 capsule (10 mg total) by mouth daily for 10 days. 06/21/18 07/01/18  Gisselle Galvis C, PA-C  doxycycline (VIBRAMYCIN) 100 MG capsule  Take 1 capsule (100 mg total) by mouth 2 (two) times daily for 10 days. 06/21/18 07/01/18  Sheyann Sulton C, PA-C  ibuprofen (ADVIL,MOTRIN) 800 MG tablet Take 1 tablet (800 mg total) by mouth 3 (three) times daily. 02/22/18   Tobey Grim, MD    Family History No family history on file.  Social History Social History   Tobacco Use  . Smoking status: Never Smoker  . Smokeless tobacco: Never Used  Substance Use Topics  . Alcohol use: No  . Drug use: No     Allergies   Patient has no known allergies.   Review of Systems Review of Systems  Constitutional: Negative for activity change, appetite change, chills, fatigue and fever.  HENT: Negative for congestion, ear pain, rhinorrhea, sinus pressure, sore throat and trouble swallowing.   Eyes: Negative for discharge and redness.  Respiratory: Positive for cough. Negative for chest tightness and shortness of breath.   Cardiovascular: Negative for chest pain.  Gastrointestinal: Negative for abdominal pain, diarrhea, nausea and vomiting.  Musculoskeletal: Negative for myalgias.  Skin: Negative for rash.  Neurological: Negative for dizziness, light-headedness and headaches.     Physical Exam Triage Vital Signs ED Triage Vitals [06/21/18 0949]  Enc Vitals Group     BP (!) 149/89     Pulse Rate 86     Resp 16     Temp 97.9 F (36.6 C)     Temp src  SpO2 100 %     Weight      Height      Head Circumference      Peak Flow      Pain Score      Pain Loc      Pain Edu?      Excl. in GC?    No data found.  Updated Vital Signs BP (!) 149/89   Pulse 86   Temp 97.9 F (36.6 C)   Resp 16   LMP 05/21/2018   SpO2 100%   Visual Acuity Right Eye Distance:   Left Eye Distance:   Bilateral Distance:    Right Eye Near:   Left Eye Near:    Bilateral Near:     Physical Exam Vitals signs and nursing note reviewed.  Constitutional:      General: She is not in acute distress.    Appearance: She is well-developed.   HENT:     Head: Normocephalic and atraumatic.     Ears:     Comments: Bilateral ears without tenderness to palpation of external auricle, tragus and mastoid, EAC's without erythema or swelling, TM's with good bony landmarks and cone of light. Non erythematous.    Nose:     Comments: Nasal mucosa slightly erythematous, swollen turbinate on the right nares    Mouth/Throat:     Comments: Oral mucosa pink and moist, no tonsillar enlargement or exudate. Posterior pharynx patent and nonerythematous, no uvula deviation or swelling. Normal phonation. Eyes:     Conjunctiva/sclera: Conjunctivae normal.  Neck:     Musculoskeletal: Neck supple.  Cardiovascular:     Rate and Rhythm: Normal rate and regular rhythm.     Heart sounds: No murmur.  Pulmonary:     Effort: Pulmonary effort is normal. No respiratory distress.     Breath sounds: Normal breath sounds.     Comments: Breathing comfortably at rest, CTABL, no wheezing, rales or other adventitious sounds auscultated Abdominal:     Palpations: Abdomen is soft.     Tenderness: There is no abdominal tenderness.  Skin:    General: Skin is warm and dry.  Neurological:     Mental Status: She is alert.      UC Treatments / Results  Labs (all labs ordered are listed, but only abnormal results are displayed) Labs Reviewed - No data to display  EKG None  Radiology Dg Chest 2 View  Result Date: 06/21/2018 CLINICAL DATA:  Intermittent cough over the last 4 months. EXAM: CHEST - 2 VIEW COMPARISON:  02/22/2018 FINDINGS: Heart size is normal. Mediastinal shadows are normal. The lungs are clear. No bronchial thickening. No infiltrate, mass, effusion or collapse. Pulmonary vascularity is normal. No bony abnormality. IMPRESSION: Normal chest.  Resolution bibasilar lung disease seen in December. Electronically Signed   By: Paulina FusiMark  Shogry M.D.   On: 06/21/2018 10:31    Procedures Procedures (including critical care time)  Medications Ordered in UC  Medications - No data to display  Initial Impression / Assessment and Plan / UC Course  I have reviewed the triage vital signs and the nursing notes.  Pertinent labs & imaging results that were available during my care of the patient were reviewed by me and considered in my medical decision making (see chart for details).     Chest x-ray negative.  Given length of symptoms will cover for atypicals with doxycycline.  Tessalon for cough.  Provided inhaler as she has previously had improvement with this as well  as started on daily cetirizine to help with any drainage that could be triggering cough.  Vital signs stable, do not suspect COVID 19 at this time but still recommended to stay home rest and drink plenty of fluids and continue to monitor for development of fever, chills, shortness of breath.Discussed strict return precautions. Patient verbalized understanding and is agreeable with plan.  Final Clinical Impressions(s) / UC Diagnoses   Final diagnoses:  Lower respiratory infection (e.g., bronchitis, pneumonia, pneumonitis, pulmonitis)     Discharge Instructions     Begin doxycycline twice daily for the next 10 days Use Tessalon every 8 hours as needed for cough May use inhaler as needed for shortness of breath, wheezing Daily cetirizine to help with postnasal drainage that could be triggering cough  Follow-up if symptoms worsening or not improving    ED Prescriptions    Medication Sig Dispense Auth. Provider   albuterol (PROVENTIL HFA;VENTOLIN HFA) 108 (90 Base) MCG/ACT inhaler Inhale 1-2 puffs into the lungs every 6 (six) hours as needed for wheezing or shortness of breath. 1 Inhaler Tyquisha Sharps C, PA-C   doxycycline (VIBRAMYCIN) 100 MG capsule Take 1 capsule (100 mg total) by mouth 2 (two) times daily for 10 days. 20 capsule Maribeth Jiles C, PA-C   Cetirizine HCl 10 MG CAPS Take 1 capsule (10 mg total) by mouth daily for 10 days. 10 capsule Atwell Mcdanel C, PA-C    benzonatate (TESSALON) 200 MG capsule Take 1 capsule (200 mg total) by mouth 3 (three) times daily as needed for up to 7 days for cough. 28 capsule Janiene Aarons C, PA-C     Controlled Substance Prescriptions [place] Controlled Substance Registry consulted? Not Applicable   Lew Dawes, [place] 06/21/18 1042

## 2018-06-21 NOTE — Discharge Instructions (Addendum)
Begin doxycycline twice daily for the next 10 days Use Tessalon every 8 hours as needed for cough May use inhaler as needed for shortness of breath, wheezing Daily cetirizine to help with postnasal drainage that could be triggering cough  Follow-up if symptoms worsening or not improving

## 2018-07-26 ENCOUNTER — Ambulatory Visit: Payer: Self-pay | Admitting: *Deleted

## 2018-07-26 ENCOUNTER — Encounter (HOSPITAL_COMMUNITY): Payer: Self-pay | Admitting: Emergency Medicine

## 2018-07-26 ENCOUNTER — Ambulatory Visit (HOSPITAL_COMMUNITY)
Admission: EM | Admit: 2018-07-26 | Discharge: 2018-07-26 | Disposition: A | Discharge status: Home / Self Care | Payer: Self-pay | Attending: Family Medicine | Admitting: Family Medicine

## 2018-07-26 ENCOUNTER — Other Ambulatory Visit: Payer: Medicaid Other

## 2018-07-26 ENCOUNTER — Ambulatory Visit (INDEPENDENT_AMBULATORY_CARE_PROVIDER_SITE_OTHER): Payer: Self-pay

## 2018-07-26 ENCOUNTER — Other Ambulatory Visit: Payer: Self-pay

## 2018-07-26 DIAGNOSIS — J189 Pneumonia, unspecified organism: Secondary | ICD-10-CM

## 2018-07-26 DIAGNOSIS — Z20822 Contact with and (suspected) exposure to covid-19: Secondary | ICD-10-CM

## 2018-07-26 MED ORDER — CEFTRIAXONE SODIUM 1 G IJ SOLR
INTRAMUSCULAR | Status: AC
  Filled 2018-07-26: qty 10

## 2018-07-26 MED ORDER — LEVOFLOXACIN 750 MG PO TABS: 750.0000 mg | ORAL_TABLET | Freq: Every day | ORAL | 0 refills | Status: AC

## 2018-07-26 MED ORDER — CEFTRIAXONE SODIUM 1 G IJ SOLR
1.0000 g | Freq: Once | INTRAMUSCULAR | Status: AC
  Administered 2018-07-26: 12:00:00 1 g via INTRAMUSCULAR

## 2018-07-26 MED ORDER — ACETAMINOPHEN 325 MG PO TABS
ORAL_TABLET | ORAL | Status: AC
  Filled 2018-07-26: qty 2

## 2018-07-26 MED ORDER — ACETAMINOPHEN 325 MG PO TABS
650.0000 mg | ORAL_TABLET | Freq: Once | ORAL | Status: AC
  Administered 2018-07-26: 10:00:00 650 mg via ORAL

## 2018-07-26 NOTE — Telephone Encounter (Signed)
Traci RN from urgent care calling to schedule an appointment for this paient [facility]. She has a cough fo ra long time, chest xray shows pneumonia and patient has tachycardia.  She does not speak english but they have the interpreter there to translate. Her  appoinment is scheduled for today (07/26/2018), at  [facility] testing site at 12 noon.

## 2018-07-26 NOTE — ED Triage Notes (Addendum)
Cough started 6 months ago, seen here for cough 2 months ago.  Patient has phlegm that is whitish/clearish per translater No sob No sore throat No diarrhea No fever

## 2018-07-26 NOTE — Discharge Instructions (Addendum)
Your x ray showed pneumonia in both of the lungs I am highly suspicious that you have COVID 19 or the corona virus.  You need to quarantine We are sending you for COVID testing Follow-up with your doctor next week for recheck If your symptoms worsen and you become SOB  you need to go the hospital Take the medication as prescribed.

## 2018-07-26 NOTE — ED Provider Notes (Signed)
[facility]    CSN: [id number] Arrival date & time: 07/26/18  0941     History   Chief Complaint Chief Complaint  Patient presents with   Cough    HPI Destiny Walker is a 43 y.o. female.   Pt is a 43 year old female that presents with fever, productive cough. This has been constant, waxing and waning over the past few months. She was seen here 2 months ago and treated for lower respiratory infection. X ray did not reveal anything at that time. She was a febrile at that visit. High fever here today and has not taken any medication.  She is feeling worse than before with body aches, fatigue.  No known COVID exposures or recent traveling.  Denies any chest pain, shortness of breath.  ROS per HPI      History reviewed. No pertinent past medical history.  Patient Active Problem List   Diagnosis Date Noted   Hyperglycemia 03/24/2018   Poor vision 02/22/2018   Cough 02/22/2018   Menorrhagia 06/29/2014   Back spasm 06/29/2014   Bilateral headaches 03/14/2014   Contraception 10/06/2013   Pelvic pain in female 09/20/2013   Ganglion cyst 09/20/2013    History reviewed. No pertinent surgical history.  OB History   No obstetric history on file.      Home Medications    Prior to Admission medications   Medication Sig Start Date End Date Taking? Authorizing Provider  cetirizine (ZYRTEC) 10 MG tablet Take 10 mg by mouth daily.   Yes [provider]  NON FORMULARY Cough medicine otc   Yes [provider]  albuterol (PROVENTIL HFA;VENTOLIN HFA) 108 (90 Base) MCG/ACT inhaler Inhale 1-2 puffs into the lungs every 6 (six) hours as needed for wheezing or shortness of breath. 06/21/18   Wieters, Hallie C, PA-C  Cetirizine HCl 10 MG CAPS Take 1 capsule (10 mg total) by mouth daily for 10 days. 06/21/18 07/01/18  Wieters, Hallie C, PA-C  ibuprofen (ADVIL,MOTRIN) 800 MG tablet Take 1 tablet (800 mg total) by mouth 3 (three) times daily. 02/22/18    Tobey GrimWalden, Jeffrey H, MD  levofloxacin (LEVAQUIN) 750 MG tablet Take 1 tablet (750 mg total) by mouth daily for 5 days. 07/26/18 07/31/18  Janace ArisBast, Jimia Gentles A, NP    Family History History reviewed. No pertinent family history.  Social History Social History   Tobacco Use   Smoking status: Never Smoker   Smokeless tobacco: Never Used  Substance Use Topics   Alcohol use: No   Drug use: No     Allergies   Patient has no known allergies.   Review of Systems Review of Systems   Physical Exam Triage Vital Signs ED Triage Vitals  Enc Vitals Group     BP 07/26/18 1003 127/79     Pulse Rate 07/26/18 1003 (!) 102     Resp 07/26/18 1003 18     Temp 07/26/18 1003 (!) 102.3 F (39.1 C)     Temp Source 07/26/18 1003 Oral     SpO2 07/26/18 1003 96 %     Weight --      Height --      Head Circumference --      Peak Flow --      Pain Score 07/26/18 0956 0     Pain Loc --      Pain Edu? --      Excl. in GC? --    No data found.  Updated Vital Signs  BP 127/79 (BP Location: Right Arm)    Pulse (!) 102    Temp (!) 102 F (38.9 C) (Oral)    Resp 18    LMP 07/26/2018 (Exact Date)    SpO2 96%   Visual Acuity Right Eye Distance:   Left Eye Distance:   Bilateral Distance:    Right Eye Near:   Left Eye Near:    Bilateral Near:     Physical Exam   UC Treatments / Results  Labs (all labs ordered are listed, but only abnormal results are displayed) Labs Reviewed - No data to display  EKG None  Radiology Dg Chest 2 View  Result Date: 07/26/2018 CLINICAL DATA:  Cough for several months EXAM: CHEST - 2 VIEW COMPARISON:  06/21/2018 FINDINGS: Cardiac shadow is stable in appearance. Patchy infiltrates are noted throughout both lungs predominantly in the mid and lower lung fields. No sizable effusion is noted. No bony abnormality is noted. IMPRESSION: Patchy bilateral infiltrates predominantly in the mid and lower lung fields. Electronically Signed   By: Alcide Clever M.D.   On:  07/26/2018 11:02    Procedures Procedures (including critical care time)  Medications Ordered in UC Medications  acetaminophen (TYLENOL) tablet 650 mg (650 mg Oral Given 07/26/18 1017)  cefTRIAXone (ROCEPHIN) injection 1 g (1 g Intramuscular Given 07/26/18 1137)    Initial Impression / Assessment and Plan / UC Course  I have reviewed the triage vital signs and the nursing notes.  Pertinent labs & imaging results that were available during my care of the patient were reviewed by me and considered in my medical decision making (see chart for details).    pneumonia  X-ray revealed bilateral patchy infiltrates in the mid and lower lung fields. Treating with Rocephin injection here in clinic and Levaquin outpatient. Patient does not meet criteria for inpatient Highly suspicious for COVID.  Sending for testing at 12:00 today Strict precautions and instructions that if her symptoms worsen and she becomes short of breath and she needs to go to the ER. Patient understanding and agree.  All information was given using the Print production planner. Final Clinical Impressions(s) / UC Diagnoses   Final diagnoses:  Pneumonia of both lungs due to infectious organism, unspecified part of lung     Discharge Instructions     Your x ray showed pneumonia in both of the lungs I am highly suspicious that you have COVID 19 or the corona virus.  You need to quarantine We are sending you for COVID testing Follow-up with your doctor next week for recheck If your symptoms worsen and you become SOB  you need to go the hospital Take the medication as prescribed.      ED Prescriptions    Medication Sig Dispense Auth. Provider   levofloxacin (LEVAQUIN) 750 MG tablet Take 1 tablet (750 mg total) by mouth daily for 5 days. 5 tablet Dahlia Byes A, NP     Controlled Substance Prescriptions [place] Controlled Substance Registry consulted? Not Applicable   Janace Aris, NP 07/26/18 1138

## 2018-07-30 ENCOUNTER — Telehealth (HOSPITAL_COMMUNITY): Payer: Self-pay | Admitting: Emergency Medicine

## 2018-07-30 LAB — NOVEL CORONAVIRUS, NAA: SARS-CoV-2, NAA: DETECTED — AB

## 2018-07-30 NOTE — Telephone Encounter (Signed)
Your test for COVID-19 was positive, meaning that you were infected with the novel coronavirus and could give the germ to others.  Please continue isolation at home, for at least 7 days since the start of your fever/cough/breathlessness and until you have had 3 consecutive days without fever (without taking a fever reducer) and with cough/breathlessness improving. Please continue good preventive care measures, including:  frequent hand-washing, avoid touching your face, cover coughs/sneezes, stay out of crowds and keep a 6 foot distance from others.  Recheck or go to the nearest hospital ED tent for re-assessment if fever/cough/breathlessness return.  With arabic interpreter, left voicemail to return call as soon as possible.

## 2018-07-31 ENCOUNTER — Telehealth (HOSPITAL_COMMUNITY): Payer: Self-pay | Admitting: Emergency Medicine

## 2018-07-31 NOTE — Telephone Encounter (Signed)
With arabic interpreter, pt called and made aware. All questions answered.

## 2018-11-08 ENCOUNTER — Ambulatory Visit (INDEPENDENT_AMBULATORY_CARE_PROVIDER_SITE_OTHER): Payer: Self-pay | Admitting: Family Medicine

## 2018-11-08 ENCOUNTER — Other Ambulatory Visit: Payer: Self-pay

## 2018-11-08 VITALS — BP 130/82 | HR 104 | Wt 146.2 lb

## 2018-11-08 DIAGNOSIS — R300 Dysuria: Secondary | ICD-10-CM

## 2018-11-08 DIAGNOSIS — R739 Hyperglycemia, unspecified: Secondary | ICD-10-CM

## 2018-11-08 DIAGNOSIS — E119 Type 2 diabetes mellitus without complications: Secondary | ICD-10-CM

## 2018-11-08 DIAGNOSIS — R350 Frequency of micturition: Secondary | ICD-10-CM

## 2018-11-08 LAB — POCT URINALYSIS DIP (MANUAL ENTRY)
Bilirubin, UA: NEGATIVE
Blood, UA: NEGATIVE
Glucose, UA: >=1000 mg/dL — AB
Leukocytes, UA: NEGATIVE
Nitrite, UA: NEGATIVE
Protein Ur, POC: NEGATIVE mg/dL
Spec Grav, UA: <=1.005 — AB (ref 1.010–1.025)
Urobilinogen, UA: 0.2 E.U./dL
pH, UA: 5.5 (ref 5.0–8.0)

## 2018-11-08 LAB — POCT GLYCOSYLATED HEMOGLOBIN (HGB A1C): HbA1c, POC (controlled diabetic range): 14.3 % — AB (ref 0.0–7.0)

## 2018-11-08 LAB — GLUCOSE, POCT (MANUAL RESULT ENTRY): POC Glucose: 457 mg/dl — AB (ref 70–99)

## 2018-11-08 MED ORDER — ACCU-CHEK SOFTCLIX LANCET DEV KIT: 1.0000 [IU] | PACK | Freq: Once | 0 refills | Status: AC

## 2018-11-08 MED ORDER — ACCU-CHEK AVIVA DEVI: 0 refills | Status: AC

## 2018-11-08 MED ORDER — ACCU-CHEK AVIVA PLUS VI STRP: ORAL_STRIP | 12 refills | Status: DC

## 2018-11-08 MED ORDER — INSULIN GLARGINE 100 UNIT/ML SOLOSTAR PEN: 10.0000 [IU] | PEN_INJECTOR | SUBCUTANEOUS | 11 refills | Status: DC

## 2018-11-08 MED ORDER — ACCU-CHEK SOFT TOUCH LANCETS MISC: 12 refills | Status: AC

## 2018-11-08 MED ORDER — BD PEN NEEDLE NANO 2ND GEN 32G X 4 MM MISC: 1.0000 [IU] | Freq: Two times a day (BID) | 12 refills | Status: DC

## 2018-11-08 NOTE — Progress Notes (Signed)
Asked by Dr. Criss Rosales to provide education on Lantus Insulin injection.   With use of Interpreter, [place] [phone], patient was educated on purpose, proper use and potential adverse effects of insulin including hypoglycemia.  Following instruction patient verbalized understanding of treatment plan.   First dose injection was conducted with use of a sample.   Drug name: Lantus (insulin glargine)  Qty: 1  LOT: [id number]  Exp.Date: 11/06/2020 The patient has been instructed regarding the correct time, dose, and frequency of taking this medication, including desired effects (target blood sugar readings).   Janeann Forehand 9:22 AM 11/08/2018  Patient was able to administer first dose in office, administration observed by April Zimmerman, CMA.    Patient was also educated on use of Accu-Chek Aviva glucometer device. Patient educated on purpose, proper use technique. Goal fasting blood sugar readings were discussed 80-125 with morning readings ~ 100 as our goal.  Hypoglycemia symptoms and management were reviewed.  Following instruction patient verbalized understanding of treatment plan and was able to demonstrate adequate technique with use of lancet and demo glucometer.   Plan to follow-up in Rx clinic in 2 weeks.

## 2018-11-08 NOTE — Patient Instructions (Signed)
It was a pleasure to see you today! Thank you for choosing [facility] for your primary care. Destiny Walker was seen for diabetes. Come back to the clinic in 2 weeks to talk about your medicine., and go to the emergency room if you start feeling very ill .    Today we talked about your diabetes, it has been starting to get worse and so we need to start you on medication.  Please take only 10 units of this insulin every single day.  Please check your blood sugars twice per day and make sure that you write the numbers down in a log so that your doctor can review them.  If you get a blood sugar lower than 100 please do not take your insulin the next day.  If you get to the point where you feel very ill or having trouble taking in enough fluid to feel that you are sustaining yourself please go to the emergency department.   Please bring all your medications to every doctors visit   Sign up for My Chart to have easy access to your labs results, and communication with your Primary care physician.     Please check-out at the front desk before leaving the clinic.     Best,  Dr. Sherene Sires FAMILY MEDICINE RESIDENT - PGY3 11/08/2018 10:02 AM

## 2018-11-08 NOTE — Progress Notes (Signed)
*Entire visit conducted with Arabic interpreter on iPad, including diabetic education with pharmacy. Subjective:  Gena FrayMariam Dipiero is a 43 y.o. female who [facility] the [facility] today with a chief complaint of urinary frequency.   HPI: Urinary frequency Patient notes dry mouth and increased urinary frequency over the last few weeks, specifically denies any injury, itching, discharge, dysuria.  Objective:  Physical Exam: BP 130/82   Pulse (!) 104   Wt 146 lb 3.2 oz (66.3 kg)   LMP 10/11/2018 (Approximate)   SpO2 99%   BMI 26.74 kg/m   Gen: NAD, conversing comfortably MSK: Patient's clothing inhibiting exam although gait was normal patient had no physical complaints. Neuro: grossly normal, moves all extremities Psych: Normal affect and thought content  Results for orders placed or performed in visit on 11/08/18 (from the past 72 hour(s))  POCT urinalysis dipstick     Status: Abnormal   Collection Time: 11/08/18  8:50 AM  Result Value Ref Range   Color, UA yellow yellow   Clarity, UA clear clear   Glucose, UA >=1,000 (A) negative mg/dL   Bilirubin, UA negative negative   Ketones, POC UA trace (5) (A) negative mg/dL   Spec Grav, UA <=9.563<=1.005 (A) 1.010 - 1.025   Blood, UA negative negative   pH, UA 5.5 5.0 - 8.0   Protein Ur, POC negative negative mg/dL   Urobilinogen, UA 0.2 0.2 or 1.0 E.U./dL   Nitrite, UA Negative Negative   Leukocytes, UA Negative Negative  POCT glycosylated hemoglobin (Hb A1C)     Status: Abnormal   Collection Time: 11/08/18  9:03 AM  Result Value Ref Range   Hemoglobin A1C     HbA1c POC (<> result, manual entry)     HbA1c, POC (prediabetic range)     HbA1c, POC (controlled diabetic range) 14.3 (A) 0.0 - 7.0 %  Glucose (CBG)     Status: Abnormal   Collection Time: 11/08/18  9:03 AM  Result Value Ref Range   POC Glucose 457 (A) 70 - 99 mg/dl     Assessment/Plan:  Urinary frequency Patient notes dry mouth and increased urinary frequency over the last  few weeks, specifically denies any injury, itching, discharge, dysuria.  Given CBG finding of over 400 and prior diagnosis of diabetes with A1c of 6.6 in January, UA showing glucose of 1000 with no indication of infection, this is likely urinary frequency due to hyperglycemia and uncontrolled diabetes.  No indication of infection and patient is starting on insulin with pharmacy consult today.  Hyperglycemia Urine showing over 1000 glucose, CBG over 400, A1c resulted half with your visit at over 14.  January 2020 had A1c of 6.6 apparently was planned to be diet controlled at that time.  New diagnosis of diabetes requiring insulin,  plan described in that problem.  Type 2 diabetes mellitus without complication, without long-term current use of insulin (HCC) CBG over 400, A1c over 14.  Was 6.6 in January 2020 given patient's moderately slim build it is very possible that there is a pancreatic failure somewhat contributing to this.  Consulted pharmacy resident who was in the office that morning, extensive education given to the patient about the use of insulin (patient will start on 10 units Lantus pen) as well as how to check CBGs daily.,  Glucometer ordered and patient follow-up scheduled in approximately a week and a half to check in with patient and dose insulin as needed.  Not starting metformin at this time as patient is likely  chance of becoming acidotic given her urinary frequency and glucose over 400.  Return precautions given, patient is very stable and comfortable in the office and there is no indication that she needs emergency admission at this time.   Sherene Sires, DO FAMILY MEDICINE RESIDENT - PGY3 11/09/2018 5:56 AM

## 2018-11-09 DIAGNOSIS — E1165 Type 2 diabetes mellitus with hyperglycemia: Secondary | ICD-10-CM | POA: Insufficient documentation

## 2018-11-09 DIAGNOSIS — E119 Type 2 diabetes mellitus without complications: Secondary | ICD-10-CM | POA: Insufficient documentation

## 2018-11-09 DIAGNOSIS — R350 Frequency of micturition: Secondary | ICD-10-CM | POA: Insufficient documentation

## 2018-11-09 NOTE — Assessment & Plan Note (Addendum)
CBG over 400, A1c over 14.  Was 6.6 in January 2020 given patient's moderately slim build it is very possible that there is a pancreatic failure somewhat contributing to this.  Consulted pharmacy resident who was in the office that morning, extensive education given to the patient about the use of insulin (patient will start on 10 units Lantus pen) as well as how to check CBGs daily.,  Glucometer ordered and patient follow-up scheduled in approximately a week and a half to check in with patient and dose insulin as needed.  Not starting metformin at this time as patient is likely chance of becoming acidotic given her urinary frequency and glucose over 400.  Return precautions given, patient is very stable and comfortable in the office and there is no indication that she needs emergency admission at this time.

## 2018-11-09 NOTE — Assessment & Plan Note (Signed)
Urine showing over 1000 glucose, CBG over 400, A1c resulted half with your visit at over 14.  January 2020 had A1c of 6.6 apparently was planned to be diet controlled at that time.  New diagnosis of diabetes requiring insulin,  plan described in that problem.

## 2018-11-09 NOTE — Assessment & Plan Note (Signed)
Patient notes dry mouth and increased urinary frequency over the last few weeks, specifically denies any injury, itching, discharge, dysuria.  Given CBG finding of over 400 and prior diagnosis of diabetes with A1c of 6.6 in January, UA showing glucose of 1000 with no indication of infection, this is likely urinary frequency due to hyperglycemia and uncontrolled diabetes.  No indication of infection and patient is starting on insulin with pharmacy consult today.

## 2018-11-22 ENCOUNTER — Ambulatory Visit (INDEPENDENT_AMBULATORY_CARE_PROVIDER_SITE_OTHER): Payer: Self-pay | Admitting: Pharmacist

## 2018-11-22 ENCOUNTER — Other Ambulatory Visit: Payer: Self-pay

## 2018-11-22 DIAGNOSIS — R739 Hyperglycemia, unspecified: Secondary | ICD-10-CM

## 2018-11-22 DIAGNOSIS — E119 Type 2 diabetes mellitus without complications: Secondary | ICD-10-CM

## 2018-11-22 MED ORDER — INSULIN GLARGINE 100 UNIT/ML SOLOSTAR PEN: 6.0000 [IU] | PEN_INJECTOR | SUBCUTANEOUS | Status: DC

## 2018-11-22 MED ORDER — METFORMIN HCL ER 500 MG PO TB24: 500.0000 mg | ORAL_TABLET | Freq: Two times a day (BID) | ORAL | 3 refills | Status: DC

## 2018-11-22 NOTE — Progress Notes (Signed)
Reviewed: Agree with Dr. Koval's documentation and management. 

## 2018-11-22 NOTE — Assessment & Plan Note (Signed)
Diabetes longstanding and recently improved control with initiation of basal insulin. Patient is able to verbalize appropriate hypoglycemia management plan. Patient reports adherence with medication. Patient asked if she could stop Lantus as she does not like the injections. -Decreased dose of basal insulin Lantus (insulin glargine) from 10 to 6 units daily. Advised to skip dose if blood sugar is <90. -Started Metformin XR 500 mg once daily for one week, increase to twice daily if tolerated. Counseled to take with food. -Extensively discussed pathophysiology of DM, recommended lifestyle interventions, dietary effects on glycemic control -Counseled on s/sx of and management of hypoglycemia -Next A1C anticipated 3 months.  -Re-evaluate insulin dose at PCP visit. 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 Will trial a decrease of insulin to see how she responds. This decrease in insulin may cause CBGs to increase into 200s. If average above 250, will need to increase Lantus back to 8-10 units.

## 2018-11-22 NOTE — Progress Notes (Signed)
    S:     Chief Complaint  Patient presents with  . Medication Management    diabetes    Patient arrives in good spirits and ambulating.  Visit conducted with use of interpreter - Rana #[id number].  Presents for diabetes evaluation, education, and management. Patient was referred on 11/08/18 by Dr. Criss Rosales.    Insurance coverage/medication affordability: Medicaid  Patient reports adherence with medications.  Current diabetes medications include: Lantus 10 units every day. Current hypertension medications include: none Current hyperlipidemia medications include: none  Patient did complain of blurry vision. Counseled that it was due to decrease in blood sugar from 400s to 100s and should go away.   Patient reports hypoglycemic events. She reports not taking Lantus yesterday due to blood sugar between 90 and 100.    O:  Physical Exam Constitutional:      Appearance: Normal appearance. She is normal weight.  Neurological:     Mental Status: She is alert.  Psychiatric:        Mood and Affect: Mood normal.        Thought Content: Thought content normal.    Review of Systems  Eyes: Positive for blurred vision.     Lab Results  Component Value Date   HGBA1C 14.3 (A) 11/08/2018   There were no vitals filed for this visit.  Lipid Panel     Component Value Date/Time   CHOL 146 02/22/2018 0909   TRIG 201 (H) 02/22/2018 0909   HDL 39 (L) 02/22/2018 0909   CHOLHDL 3.7 02/22/2018 0909   LDLCALC 67 02/22/2018 0909    Home CBG:  7 day average 124 14 day average 144  Lowest was 93   Clinical ASCVD: No  The 10-year ASCVD risk score Mikey Bussing DC Jr., et al., 2013) is: 3.6%   Values used to calculate the score:     Age: 43 years     Sex: Female     Is Non-Hispanic African American: Yes     Diabetic: Yes     Tobacco smoker: No     Systolic Blood Pressure: 696 mmHg     Is BP treated: No     HDL Cholesterol: 39 mg/dL     Total Cholesterol: 146 mg/dL    A/P: Diabetes  longstanding and recently improved control with initiation of basal insulin. Patient is able to verbalize appropriate hypoglycemia management plan. Patient reports adherence with medication. Patient asked if she could stop Lantus as she does not like the injections. -Decreased dose of basal insulin Lantus (insulin glargine) from 10 to 6 units daily. Advised to skip dose if blood sugar is <90. -Started Metformin XR 500 mg once daily for one week, increase to twice daily if tolerated. Counseled to take with food. -Extensively discussed pathophysiology of DM, recommended lifestyle interventions, dietary effects on glycemic control -Counseled on s/sx of and management of hypoglycemia -Next A1C anticipated 3 months.  -Re-evaluate insulin dose at PCP visit. 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 Will trial a decrease of insulin to see how she responds. This decrease in insulin may cause CBGs to increase into 200s. If average above 250, will need to increase Lantus back to 8-10 units.  Written patient instructions provided.  Total time in face to face counseling 35 minutes.   Follow up with PCP Dr. Owens Shark with Clinic Visit in 2-3 weeks.   Patient seen with Murlean Iba, PharmD Candidate and Gerre Pebbles, PharmD PGY-1 Resident.

## 2018-11-22 NOTE — Patient Instructions (Addendum)
It was good seeing you today!  Start Metformin XR 500 mg once daily for a week, then increase to twice daily. Take with food.  Decrease Lantus dose from 10 units to 6 units once daily.  Follow up with Dr. Owens Shark in 2-3 weeks.   --      !   Metformin XR 500 mg           .   .      10   6    .        2-3 .

## 2019-06-12 ENCOUNTER — Ambulatory Visit: Payer: Medicaid Other | Admitting: Family Medicine

## 2020-04-05 ENCOUNTER — Ambulatory Visit (INDEPENDENT_AMBULATORY_CARE_PROVIDER_SITE_OTHER): Payer: Self-pay | Admitting: Family Medicine

## 2020-04-05 ENCOUNTER — Encounter: Payer: Self-pay | Admitting: Family Medicine

## 2020-04-05 ENCOUNTER — Other Ambulatory Visit: Payer: Self-pay

## 2020-04-05 VITALS — BP 118/74 | HR 100 | Wt 148.8 lb

## 2020-04-05 DIAGNOSIS — N926 Irregular menstruation, unspecified: Secondary | ICD-10-CM

## 2020-04-05 DIAGNOSIS — E1165 Type 2 diabetes mellitus with hyperglycemia: Secondary | ICD-10-CM

## 2020-04-05 DIAGNOSIS — Z1159 Encounter for screening for other viral diseases: Secondary | ICD-10-CM

## 2020-04-05 DIAGNOSIS — Z114 Encounter for screening for human immunodeficiency virus [HIV]: Secondary | ICD-10-CM

## 2020-04-05 DIAGNOSIS — Z794 Long term (current) use of insulin: Secondary | ICD-10-CM

## 2020-04-05 DIAGNOSIS — E119 Type 2 diabetes mellitus without complications: Secondary | ICD-10-CM

## 2020-04-05 LAB — POCT URINALYSIS DIP (MANUAL ENTRY)
Bilirubin, UA: NEGATIVE
Blood, UA: NEGATIVE
Glucose, UA: >=1000 mg/dL — AB
Ketones, POC UA: NEGATIVE mg/dL
Leukocytes, UA: NEGATIVE
Nitrite, UA: NEGATIVE
Protein Ur, POC: NEGATIVE mg/dL
Spec Grav, UA: 1.015 (ref 1.010–1.025)
Urobilinogen, UA: 0.2 E.U./dL
pH, UA: 6 (ref 5.0–8.0)

## 2020-04-05 LAB — POCT GLYCOSYLATED HEMOGLOBIN (HGB A1C): Hemoglobin A1C: 14.7 % — AB (ref 4.0–5.6)

## 2020-04-05 LAB — POCT UA - MICROSCOPIC ONLY

## 2020-04-05 LAB — GLUCOSE, POCT (MANUAL RESULT ENTRY): POC Glucose: 329 mg/dl — AB (ref 70–99)

## 2020-04-05 LAB — POCT URINE PREGNANCY: Preg Test, Ur: NEGATIVE

## 2020-04-05 MED ORDER — METFORMIN HCL ER 500 MG PO TB24: 500.0000 mg | ORAL_TABLET | Freq: Two times a day (BID) | ORAL | 3 refills | Status: DC

## 2020-04-05 MED ORDER — ACCU-CHEK SOFTCLIX LANCETS MISC: 12 refills | Status: DC

## 2020-04-05 MED ORDER — ACCU-CHEK AVIVA PLUS VI STRP: ORAL_STRIP | 12 refills | Status: DC

## 2020-04-05 MED ORDER — ACCU-CHEK AVIVA PLUS W/DEVICE KIT: PACK | 0 refills | Status: DC

## 2020-04-05 MED ORDER — INSULIN GLARGINE 100 UNIT/ML SOLOSTAR PEN: 10.0000 [IU] | PEN_INJECTOR | SUBCUTANEOUS | 11 refills | Status: DC

## 2020-04-05 MED ORDER — BASAGLAR KWIKPEN 100 UNIT/ML ~~LOC~~ SOPN: PEN_INJECTOR | SUBCUTANEOUS | 0 refills | Status: DC

## 2020-04-05 MED ORDER — INSULIN PEN NEEDLE 32G X 4 MM MISC: 3 refills | Status: AC

## 2020-04-05 NOTE — Progress Notes (Signed)
    SUBJECTIVE:   CHIEF COMPLAINT / HPI:  The patient speaks Arabic as their primary language.  An interpreter was used for the entire visit.  Patient joined by her husband Destiny Walker.   Destiny Walker is a pleasant 45 yo presenting today to discuss diabetes and dry mouth.   Patient reports her only concern today is a dry mouth.  She denies any other concerns.  She reports overall she is been doing well but she has had difficulty scheduling appointment has when she calls in she has not offered interpreter. She does specifically denies polyuria or polydipsia.  She is taking no medications currently.  She reports she is hesitant to restart insulin but will do so if she has to.  Patient is currently taking [place] language classes and doing well.  She lives with her husband.  She does not smoke.   Medical history reviewed and notable for the following Type 2 diabetes not currently on insulin, A1c was fourteen 1 year ago In 2020 at the start of the Covid pandemic she had a respiratory illness and had abnormal chest x-ray.  She denies ongoing symptoms from this feels overall well today.  She has had 2 Covid vaccines. She is due for multiple healthcare maintenance items including a Pap smear. OBJECTIVE:   BP 118/74   Pulse 100   Wt 148 lb 12.8 oz (67.5 kg)   SpO2 97%   BMI 25.47 kg/m   Today's weight:  Last Weight  Most recent update: 04/05/2020  8:51 AM   Weight  67.5 kg (148 lb 12.8 oz)           Review of prior weights: [employer]   04/05/20 0851  Weight: 148 lb 12.8 oz (67.5 kg)    Pleasant woman Poor dentition several missing teeth Cardiac: Regular rate and rhythm. Normal S1/S2. No murmurs, rubs, or gallops appreciated. Lungs: Clear bilaterally to ascultation.  Abdomen: Normoactive bowel sounds. No tenderness to deep or light palpation. No rebound or guarding.   Psych: Pleasant and appropriate  Urine microscopy is negative for hematuria today CBG is in the 300s A1c  greater than 14  ASSESSMENT/PLAN:   Type 2 diabetes mellitus with hyperglycemia (HCC) Discussed at length.  We briefly discussed dietary changes although the patient would like to speak with a nutritionist.  Will discuss with Dr.Sykes.  Restarted glargine 10 units- patient injected first dose in clinic Sample ordered in [place] and given to patient Discussed priming, cleaning, injecting, storage in fridge Restart metformin BID Rx for supplies to pharmacy  Follow up with Dr. Valentina Lucks to discuss insulin titration on Thursday Follow up with PCP in March Total time >40 minutes    Dry Mouth and Poor Dentition Discussed brushing and flossing Dentist list at follow up Suspect due to diabetes, BMP today   Dyslipidemia  Need for statin therapy We will discuss contraception at next visit and start a statin based on her lipid panel today Lipid panel today   Healthcare maintenance At follow-up the following items need to be addressed and are important and immunizations, Pap smear and contraception - Flu, PCV23, Hep A, MMR, Varicella   Dorris Singh, [place]

## 2020-04-05 NOTE — Assessment & Plan Note (Signed)
Discussed at length.  We briefly discussed dietary changes although the patient would like to speak with a nutritionist.  Will discuss with Dr.Sykes.  Restarted glargine 10 units- patient injected first dose in clinic Sample ordered in Epic and given to patient Discussed priming, cleaning, injecting, storage in fridge Restart metformin BID Rx for supplies to pharmacy  Follow up with Dr. Raymondo Band to discuss insulin titration on Thursday Follow up with PCP in March Total time >40 minutes

## 2020-04-05 NOTE — Patient Instructions (Addendum)
It was wonderful to see you today.  Please bring ALL of your medications with you to every visit.   Today we talked about:  --Coming back on Thursday to check with Dr. Raymondo Band  -- Going to the lab to check kidneys      Thank you for choosing [facility].   Please call [phone] with any questions about today's appointment.  Please be sure to schedule follow up at the front  desk before you leave today.   [name], MD  Family Medicine

## 2020-04-06 LAB — HIV ANTIBODY (ROUTINE TESTING W REFLEX): HIV Screen 4th Generation wRfx: NONREACTIVE

## 2020-04-06 LAB — BASIC METABOLIC PANEL
BUN/Creatinine Ratio: 21 (ref 9–23)
BUN: 16 mg/dL (ref 6–24)
CO2: 22 mmol/L (ref 20–29)
Calcium: 9.7 mg/dL (ref 8.7–10.2)
Chloride: 101 mmol/L (ref 96–106)
Creatinine, Ser: 0.77 mg/dL (ref 0.57–1.00)
GFR calc Af Amer: 107 mL/min/{1.73_m2} (ref >59)
GFR calc non Af Amer: 93 mL/min/{1.73_m2} (ref >59)
Glucose: 347 mg/dL — ABNORMAL HIGH (ref 65–99)
Potassium: 4.5 mmol/L (ref 3.5–5.2)
Sodium: 139 mmol/L (ref 134–144)

## 2020-04-06 LAB — LIPID PANEL
Chol/HDL Ratio: 4.3 ratio (ref 0.0–4.4)
Cholesterol, Total: 167 mg/dL (ref 100–199)
HDL: 39 mg/dL — ABNORMAL LOW (ref >39)
LDL Chol Calc (NIH): 93 mg/dL (ref 0–99)
Triglycerides: 206 mg/dL — ABNORMAL HIGH (ref 0–149)
VLDL Cholesterol Cal: 35 mg/dL (ref 5–40)

## 2020-04-06 LAB — HCV INTERPRETATION

## 2020-04-06 LAB — HCV AB W REFLEX TO QUANT PCR: HCV Ab: <0.1 s/co ratio (ref 0.0–0.9)

## 2020-04-08 ENCOUNTER — Telehealth: Payer: Self-pay | Admitting: Family Medicine

## 2020-04-08 ENCOUNTER — Encounter: Payer: Self-pay | Admitting: Family Medicine

## 2020-04-08 NOTE — Telephone Encounter (Signed)
Called and left generic voicemail in Arabic for patient.   [name], MD  Family Medicine Teaching Service

## 2020-04-11 ENCOUNTER — Encounter: Payer: Self-pay | Admitting: Pharmacist

## 2020-04-11 ENCOUNTER — Ambulatory Visit (INDEPENDENT_AMBULATORY_CARE_PROVIDER_SITE_OTHER): Payer: Self-pay | Admitting: Pharmacist

## 2020-04-11 ENCOUNTER — Other Ambulatory Visit: Payer: Self-pay

## 2020-04-11 VITALS — BP 118/84 | HR 93

## 2020-04-11 DIAGNOSIS — Z794 Long term (current) use of insulin: Secondary | ICD-10-CM

## 2020-04-11 DIAGNOSIS — E1165 Type 2 diabetes mellitus with hyperglycemia: Secondary | ICD-10-CM

## 2020-04-11 DIAGNOSIS — E119 Type 2 diabetes mellitus without complications: Secondary | ICD-10-CM

## 2020-04-11 MED ORDER — TRULICITY 0.75 MG/0.5ML ~~LOC~~ SOAJ: 0.7500 mg | SUBCUTANEOUS | 1 refills | Status: DC

## 2020-04-11 NOTE — Assessment & Plan Note (Signed)
Diabetes longstanding currently uncontrolled, however sugars have improved since starting Lantus. Medication adherence appears optimal. Patient is able to verbalize appropriate hypoglycemia management plan. Encouraged patient to aim for a diet full of vegetables, fruit and lean meats (chicken, [place], fish) and to limit carbs (bread, pasta, sugar, rice) and red meat consumption. Encouraged patient to exercise 20-30 minutes daily with the goal of 150 minutes per week. Patient verbalized understanding. -Started GLP-1 Trulicity (generic name dulaglutide) 0.75 mg weekly -Continued basal insulin Lantus (insulin glargine) 10 units daily  -Continued metformin XR 500 mg BID  -Extensively discussed pathophysiology of diabetes, recommended lifestyle interventions, dietary effects on blood sugar control -Counseled on s/sx of and management of hypoglycemia -Next A1C anticipated April 2022

## 2020-04-11 NOTE — Progress Notes (Signed)
Reviewed: I agree with Dr. Koval's documentation and management. 

## 2020-04-11 NOTE — Progress Notes (Signed)
Arabic Interpreter: Jerilee Field [phone]  S:     Chief Complaint  Patient presents with   Medication Management    Diabetes   Patient arrives in good spirits accompanied with her husband. Presents for diabetes evaluation, education, and management Patient was referred and last seen by Primary Care Provider on 04/05/20 and restarted on Lantus 10 units daily.  Today, patient reports medication adherence and no side effects with Lantus 10 units daily (AM) and metformin twice daily. Reports last PBG 240 and usually in the 200s, with a few BG 300s ~2 weeks ago (none in the 100s yet). Denies BG<70. Denies nocturia, neuropathy, and visual changes. Discussed current diet and exercise regimen below.  Family/Social History:  -Fhx: none -Tobacco use: denies  Insurance coverage/medication affordability: Medicaid  Medication adherence reported good.   Current diabetes medications include: Lantus 10 units daily (AM), metformin XR 500 mg BID Current hypertension medications include: none  Current hyperlipidemia medications include: none   Patient denies hypoglycemic events.  Patient reported dietary habits: fish, meat, chicken, vegetables, green apple, nuts, water, little soda, coffee/tea w/o sugar  Patient-reported exercise habits: walks 30 minutes 2-3x/week   Patient denies nocturia (nighttime urination).  Patient denies neuropathy (nerve pain). Patient denies visual changes. Patient reports self foot exams.     O:  Physical Exam Vitals reviewed.  Neurological:     General: No focal deficit present.     Mental Status: She is alert.  Psychiatric:        Mood and Affect: Mood normal.        Behavior: Behavior normal.        Thought Content: Thought content normal.    Review of Systems  All other systems reviewed and are negative.   Lab Results  Component Value Date   HGBA1C 14.7 (A) 04/05/2020   Vitals:   04/11/20 0942  BP: 118/84  Pulse: 93  SpO2: 98%    Lipid Panel      Component Value Date/Time   CHOL 167 04/05/2020 1004   TRIG 206 (H) 04/05/2020 1004   HDL 39 (L) 04/05/2020 1004   CHOLHDL 4.3 04/05/2020 1004   LDLCALC 93 04/05/2020 1004    Home fasting blood sugars: not checking 2 hour post-meal/random blood sugars: 200s  Clinical Atherosclerotic Cardiovascular Disease (ASCVD): No  The 10-year ASCVD risk score Denman George DC Jr., et al., 2013) is: 3.1%   Values used to calculate the score:     Age: 45 years     Sex: Female     Is Non-Hispanic African American: Yes     Diabetic: Yes     Tobacco smoker: No     Systolic Blood Pressure: 118 mmHg     Is BP treated: No     HDL Cholesterol: 39 mg/dL     Total Cholesterol: 167 mg/dL    A/P: Diabetes longstanding currently uncontrolled, however sugars have improved since starting Lantus. Medication adherence appears optimal. Patient is able to verbalize appropriate hypoglycemia management plan. Encouraged patient to aim for a diet full of vegetables, fruit and lean meats (chicken, [place], fish) and to limit carbs (bread, pasta, sugar, rice) and red meat consumption. Encouraged patient to exercise 20-30 minutes daily with the goal of 150 minutes per week. Patient verbalized understanding. -Started GLP-1 Trulicity (generic name dulaglutide) 0.75 mg weekly -Continued basal insulin Lantus (insulin glargine) 10 units daily  -Continued metformin XR 500 mg BID  -Extensively discussed pathophysiology of diabetes, recommended lifestyle interventions, dietary effects on blood sugar  control -Counseled on s/sx of and management of hypoglycemia -Next A1C anticipated April 2022   ASCVD risk - primary prevention in patient with diabetes. Last LDL is controlled. ASCVD risk score is not >20%  - moderate intensity statin indicated. Aspirin is not indicated.  -Consider starting a statin at next follow-up visit.  Written patient instructions provided.  Total time in face to face counseling 45 minutes.    Follow up  Pharmacist Clinic Visit in 2 weeks.   Patient seen with Michiel Cowboy, PharmD Candidate, Calton Dach, PharmD - PGY-1 Resident, and Fabio Neighbors, PharmD, BCPS - PGY2 Pharmacy Resident.

## 2020-04-11 NOTE — Patient Instructions (Addendum)
  !         80-130     180  .   :   Trulicity 0.75 mg      10        XR 500            (    )     .          .           (   ).             (  )             . sarirt biruyatik alyawma! hadafuk alsukar fi aldam hu 80-130 qabl al'akl wa'aqala min 180 baed al'akli. taghyirat aldawa'i: abda bitanawul Trulicity 0.75 mg maratan asbweyaan yastamiru lantus 10 wahadat yawmiana astamira fi tanawul almitfurmin XR 500 majami maratayn ywmyan raqab sukar aldam fi almanzil wahtafaz bisijil (jlukumitr 'aw qiteat min alwaraq) li'iihdariha maeak fi ziaratik alqadimati. aistamara fi aleamal aljayid mae alnizam alghidhayiyi waltamarin alriyadiati. aistahdaf nzaman ghdhayyan mlyyan bialkhadrawat walfawakih walluhum alkhaliat min alduhun (aldajaj waldik alruwmii wal'asmak). hawal alhada min tanawul almilih ean tariq tanawul alkhudar altaazajat 'aw almujamada (bdlaan min almuealabati) , washtaf alkhudar almuealabat qabl altahi wala tudif 'aya milh 'iidafiin 'iilaa alwajabati   It was nice to see you today!  Your goal blood sugar is 80-130 before eating and less than 180 after eating.  Medication Changes: Begin taking Trulicity 0.75 mg once weekly  Continue Lantus 10 units daily  Continue metformin XR 500 mg twice daily  Monitor blood sugars at home and keep a log (glucometer or piece of paper) to bring with you to your next visit.  Keep up the good work with diet and exercise. Aim for a diet full of vegetables, fruit and lean meats  (chicken, [place], fish). Try to limit salt intake by eating fresh or frozen vegetables (instead of canned), rinse canned vegetables prior to cooking and do not add any additional salt to meals.

## 2020-04-25 ENCOUNTER — Ambulatory Visit (INDEPENDENT_AMBULATORY_CARE_PROVIDER_SITE_OTHER): Payer: Self-pay | Admitting: Pharmacist

## 2020-04-25 ENCOUNTER — Encounter: Payer: Self-pay | Admitting: Pharmacist

## 2020-04-25 ENCOUNTER — Other Ambulatory Visit: Payer: Self-pay

## 2020-04-25 VITALS — BP 122/74 | HR 96 | Wt 149.6 lb

## 2020-04-25 DIAGNOSIS — Z794 Long term (current) use of insulin: Secondary | ICD-10-CM

## 2020-04-25 DIAGNOSIS — E1165 Type 2 diabetes mellitus with hyperglycemia: Secondary | ICD-10-CM

## 2020-04-25 DIAGNOSIS — E119 Type 2 diabetes mellitus without complications: Secondary | ICD-10-CM

## 2020-04-25 MED ORDER — BASAGLAR KWIKPEN 100 UNIT/ML ~~LOC~~ SOPN: 10.0000 [IU] | PEN_INJECTOR | SUBCUTANEOUS | 1 refills | Status: DC

## 2020-04-25 MED ORDER — TRULICITY 0.75 MG/0.5ML ~~LOC~~ SOAJ: 0.7500 mg | SUBCUTANEOUS | 1 refills | Status: DC

## 2020-04-25 NOTE — Progress Notes (Signed)
S:   [name]  Chief Complaint  Patient presents with  . Medication Management    Diabetes    Patient arrives in good spirits, ambulating without assistance. Presents for diabetes evaluation, education, and management.   Patient was referred and last seen by Primary Care Provider, Dr. Manson Passey, on 04/05/2020.   She was last seen in Rx clinic 04/11/2020.  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   Current diabetes medications include: insulin 10 units , metformin 500 BID  Patient denies hypoglycemic events. Glucose monitor shows range of 150-200s consistently.  Morning sugars in 190s-200s Evening sugars in the 200s  Patient reported dietary habits: Eats 3 meals/day Breakfast: vegetables or apple or fish Lunch/dinner: little bit of rice with beef or chicken Snacks: peanut butter, almonds, cashews Drinks: sometimes coffee/milk, little bit of soda   Patient-reported exercise habits: walking x1 hr    Patient denies nocturia (nighttime urination).  Patient denies neuropathy (nerve pain). Patient denies visual changes. Patient reports self foot exams.    O:  Physical Exam Vitals reviewed.  Constitutional:      Appearance: Normal appearance.  Neurological:     Mental Status: She is alert.  Psychiatric:        Mood and Affect: Mood normal.        Behavior: Behavior normal.    Review of Systems  All other systems reviewed and are negative.    Lab Results  Component Value Date   HGBA1C 14.7 (A) 04/05/2020   Vitals:   04/25/20 0923  BP: 122/74  Pulse: 96  SpO2: 98%    Lipid Panel     Component Value Date/Time   CHOL 167 04/05/2020 1004    TRIG 206 (H) 04/05/2020 1004   HDL 39 (L) 04/05/2020 1004   CHOLHDL 4.3 04/05/2020 1004   LDLCALC 93 04/05/2020 1004    Home fasting blood sugars: no hypoglycemic events, no BGs < 100, BG range in the upper  100s-200s.   Clinical Atherosclerotic Cardiovascular Disease (ASCVD): No  The 10-year ASCVD risk score Denman George DC Jr., et al., 2013) is: 3.6%   Values used to calculate the score:     Age: 45 years     Sex: Female     Is Non-Hispanic African American: Yes     Diabetic: Yes     Tobacco smoker: No     Systolic Blood Pressure: 122 mmHg     Is BP treated: No     HDL Cholesterol: 39 mg/dL     Total Cholesterol: 167 mg/dL    A/P: Diabetes longstanding currently uncontrolled. Patient is able to verbalize appropriate hypoglycemia management plan Medication adherence appears adequate. Control is suboptimal due to unable to afford trulicity, provided samples during this visit. -Continued basal insulin  (insulin glargine) and gave patient sample Basaglar (see below)  - Initiated Trulicity 0.75mg  once weekly.  -Extensively discussed pathophysiology of diabetes, recommended lifestyle interventions, dietary effects on blood sugar control -Counseled on s/sx of and management of hypoglycemia   Samples Provided:  Medication Samples have been provided to the patient. Drug name: Trulicity     Strength: 0.75mg         Qty: 4 pens  LOT: [id number]  Exp.Date: 11/29/2020  Dosing instructions: administer 0.75mg  (1 dose) via subcutaneous injection once weekly on Thursdays  Drug name: Basaglar (insulin glargine)    Strength: 100 units/mL    Qty: 1 pen   LOT: [id number]   Exp.Date: 09/05/2020 Dosing instructions: administer 10 units via subcutaneous injection once daily in the mornings   Patient assistance application started for Trulicity and Hospital doctor through manufacturer.  Patient will bring paystubs(husband) back to office.  Jannette Spanner, Radiographer, therapeutic.    The patient has been  instructed regarding the correct time, dose, and frequency of taking these medications, including desired effects and most common side effects.    Written patient instructions provided.  Total time in face to face counseling 30 minutes.   Follow up Pharmacist is 5 weeks  Calton Dach, PharmD - PGY-1 Resident, and Fabio Neighbors, PharmD, BCPS - PGY2 Pharmacy Resident.

## 2020-04-25 NOTE — Progress Notes (Signed)
Reviewed: I agree with Dr. Koval's documentation and management. 

## 2020-04-25 NOTE — Patient Instructions (Addendum)
It was nice to meet with you today. 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  Finish your home supply of insulin glargine (lantus) at home. When that runs out, please start to use Basaglar (Kwikpen) 10 units every morning.    Please start to take Trulicity 0.75mg  once weekly. You administered your first dose of Trulicity in clinic with me and continue to administer Trulicity every Thursday.      .          !        ()  .         Basaglar (Kwikpen) 10   .     Trulicity 0.75mg    .       Trulicity       Trulicity

## 2020-04-25 NOTE — Assessment & Plan Note (Signed)
Diabetes longstanding currently uncontrolled. Patient is able to verbalize appropriate hypoglycemia management plan Medication adherence appears adequate. Control is suboptimal due to unable to afford trulicity, provided samples during this visit. -Continued basal insulin  (insulin glargine) and gave patient sample Basaglar (see below)  - Initiated Trulicity 0.75mg  once weekly.

## 2020-05-09 NOTE — Progress Notes (Signed)
Submitted application for TRULICITY 1.5MG /0.5ML & BASAGLAR KWIKPEN 100U/ML to [employer] for patient assistance.   Phone: [phone]

## 2020-05-10 ENCOUNTER — Other Ambulatory Visit: Payer: Self-pay | Admitting: Family Medicine

## 2020-05-13 ENCOUNTER — Ambulatory Visit: Payer: Medicaid Other | Admitting: Family Medicine

## 2020-05-13 ENCOUNTER — Telehealth: Payer: Self-pay | Admitting: Family Medicine

## 2020-05-13 NOTE — Telephone Encounter (Signed)
Called with arabic interpreter to reschedule visit.  [name], MD  Family Medicine Teaching Service

## 2020-05-22 NOTE — Progress Notes (Signed)
Received notification from [facility] [employer] regarding approval for TRULICITY 1.5MG /0.5ML. Patient assistance approved from 05/15/20 to 05/15/21.  Meds will auto refill & ship to patients home from [employer].  [facility]: [phone] Phone: [phone]

## 2020-05-30 ENCOUNTER — Encounter: Payer: Self-pay | Admitting: Pharmacist

## 2020-05-30 ENCOUNTER — Ambulatory Visit (INDEPENDENT_AMBULATORY_CARE_PROVIDER_SITE_OTHER): Payer: Self-pay | Admitting: Pharmacist

## 2020-05-30 ENCOUNTER — Other Ambulatory Visit: Payer: Self-pay

## 2020-05-30 VITALS — BP 112/70 | HR 109 | Ht 64.5 in | Wt 151.6 lb

## 2020-05-30 DIAGNOSIS — E1165 Type 2 diabetes mellitus with hyperglycemia: Secondary | ICD-10-CM

## 2020-05-30 DIAGNOSIS — E119 Type 2 diabetes mellitus without complications: Secondary | ICD-10-CM

## 2020-05-30 DIAGNOSIS — Z794 Long term (current) use of insulin: Secondary | ICD-10-CM

## 2020-05-30 MED ORDER — TRULICITY 1.5 MG/0.5ML ~~LOC~~ SOAJ: 1.5000 mg | SUBCUTANEOUS | 0 refills | Status: DC

## 2020-05-30 NOTE — Assessment & Plan Note (Signed)
Diabetes diagnosed in 2020, currently controlled based on home readings.  Much improved with use of GLP (Trulicity (dulaglutide).  Medication adherence appears to be adequate. Improved blood glucose readings and decreased frequent urination since starting Trulicity. The patient appears to be tolerating the 1.5 mg dose of Trulicity without any side effects. Control is improved to a point that patient is skipping insulin glargine ~3 times per week due to fasting blood glucose readings <100.  Due to the patient's current blood glucose control and frequent skipping of insulin glargine, will discontinue the long acting insulin to prevent hypoglycemia. -Discontinued basal insulin glargine (Basaglar) to prevent low blood glucose readings -Continued GLP-1 dulaglutide (Trulicity) 1.5 mg under the skin once weekly on Tuesdays -Continued metformin XR 500 mg PO BID -Extensively discussed pathophysiology of diabetes, recommended lifestyle interventions, dietary effects on blood sugar control -Counseled on s/sx of and management of hypoglycemia -Patient provided with Trulicity samples -Next A1C anticipated at the end of April 2022

## 2020-05-30 NOTE — Patient Instructions (Addendum)
  !         80-130     180  .   :    Trulicity 1.5 mg       (Basaglar  Lantus)    500  - 1            (    )     .          .           (   ).             (  )             . sarirt biruyatik alyawma! hadafuk alsukar fi aldam hu 80-130 qabl al'akl wa'aqala min 180 baed al'akli. taghyirat aldawa'i: astamirr fi tanawul Trulicity 1.5 mg asbweyaan tawaqaf ean tanawul al'ansulin (Basaglar 'aw Lantus) tuasil mitfurmin 500 mujama - 1 qurs maratayn yawmiana raqab sukar aldam fi almanzil wahtafaz bisijil (jlukumitr 'aw qiteat min alwaraq) li'iihdariha maeak fi ziaratik alqadimati. aistamara fi aleamal aljayid mae alnizam alghidhayiyi waltamarin alriyadiati. aistahdaf nzaman ghdhayyan mlyyan bialkhadrawat walfawakih walluhum alkhaliat min alduhun (aldajaj waldik alruwmii wal'asmak). hawal alhada min tanawul almilih ean tariq tanawul alkhudar altaazajat 'aw almujamada (bdlaan min almuealabati) , washtaf alkhudar almuealabat qabl altahi wala tudif 'aya milh 'iidafiin 'iilaa alwajabati.    It was nice to see you today!  Your goal blood sugar is 80-130 before eating and less than 180 after eating.  Medication Changes: Continue taking Trulicity 1.5 mg weekly  STOP taking insulin (Basaglar or Lantus)  Continue metformin 500 mg - 1 tablet twice daily  Monitor blood sugars at home and keep a log (glucometer or piece of paper) to bring with you to your next visit.  Keep up the good work with diet and exercise. Aim for a diet full of  vegetables, fruit and lean meats (chicken, [place], fish). Try to limit salt intake by eating fresh or frozen vegetables (instead of canned), rinse canned vegetables prior to cooking and do not add any additional salt to meals.

## 2020-05-30 NOTE — Progress Notes (Signed)
**  MEDICATION CANCELLED, PT NO LONGER NEEDS, PER DR. KOVAL - CALLED [employer] 05/30/20 TO END ASSISTANCE**   Received notification from [employer] regarding approval for [facility]. Patient assistance approved from 05/27/20 to 05/27/21.   Phone: [phone]

## 2020-05-30 NOTE — Progress Notes (Signed)
S:     Chief Complaint  Patient presents with  . Medication Management    DM follow-up    Patient arrives in good spirits. The visit was completed using an Arabic interpreter Osborne Casco, [phone]). The patient provided verbal consent for using the interpreter service for the visit today. Presents for diabetes evaluation, education, and management. Patient was referred and last seen by Primary Care Provider, Dr. Manson Passey, on 04/05/20. Last seen by pharmacy on 04/25/20  Family/Social History: None Tobacco Use: Denies Insurance Coverage/Medication Affordability: Medicaid [place] [facility]  Medication adherence reported to be fair. The patient reported taking all medications as prescribed with the exception of insulin glargine as she does not take this when her blood glucose is <100 (patient reports skipping this 3 times per week on average). She states that she takes Trulicity (dulaglutide) every Tuesday and that her last dose was on 3/22.  Current diabetes medications include: Trulicity 1.5 mg weekly, Basaglar (insulin glargine) 10 units daily, metformin XR 500 mg BID Current hypertension medications include: None Current hyperlipidemia medications include: None  Patient denies hypoglycemic events.  Patient reported dietary habits: Eats 2-3 meals/day Breakfast: bread with fava beans and cheese Lunch: bread with fava beans, eggs, salad, 1 piece of meat or fish Dinner: small amount of rice with beef or chicken Snacks: peanut butter, almonds, cashews, apples, grapes Drinks: cut out soda and sugary drinks  Patient-reported exercise habits: walking x1 hour most days of the week, home exercises   Patient denies nocturia (nighttime urination) - she reports that this is much improved since starting Trulicity and Basaglar Patient denies neuropathy (nerve pain). Patient denies visual changes.    O:  Physical Exam Constitutional:      Appearance: Normal appearance.  Neurological:     Mental  Status: She is alert.  Psychiatric:        Mood and Affect: Mood normal.        Behavior: Behavior normal.    Review of Systems  All other systems reviewed and are negative.    Lab Results  Component Value Date   HGBA1C 14.7 (A) 04/05/2020   Vitals:   05/30/20 0912  BP: 112/70  Pulse: (!) 109  SpO2: 97%    Lipid Panel     Component Value Date/Time   CHOL 167 04/05/2020 1004   TRIG 206 (H) 04/05/2020 1004   HDL 39 (L) 04/05/2020 1004   CHOLHDL 4.3 04/05/2020 1004   LDLCALC 93 04/05/2020 1004    Home fasting blood sugars: 100-125 (lowest reading since the last visit was 90) 2 hour post-meal/random blood sugars: 140-150 (highest reading seen since the last visit was 160)  Clinical Atherosclerotic Cardiovascular Disease (ASCVD): No  The 10-year ASCVD risk score Denman George DC Jr., et al., 2013) is: 2.5%   Values used to calculate the score:     Age: 45 years     Sex: Female     Is Non-Hispanic African American: Yes     Diabetic: Yes     Tobacco smoker: No     Systolic Blood Pressure: 112 mmHg     Is BP treated: No     HDL Cholesterol: 39 mg/dL     Total Cholesterol: 167 mg/dL    A/P: Diabetes diagnosed in 2020, currently controlled based on home readings.  Much improved with use of GLP (Trulicity (dulaglutide).  Medication adherence appears to be adequate. Improved blood glucose readings and decreased frequent urination since starting Trulicity. The patient appears to be  tolerating the 1.5 mg dose of Trulicity without any side effects. Control is improved to a point that patient is skipping insulin glargine ~3 times per week due to fasting blood glucose readings <100.  Due to the patient's current blood glucose control and frequent skipping of insulin glargine, will discontinue the long acting insulin to prevent hypoglycemia. -Discontinued basal insulin glargine (Basaglar) to prevent low blood glucose readings -Continued GLP-1 dulaglutide (Trulicity) 1.5 mg under the skin  once weekly on Tuesdays -Continued metformin XR 500 mg PO BID -Extensively discussed pathophysiology of diabetes, recommended lifestyle interventions, dietary effects on blood sugar control -Counseled on s/sx of and management of hypoglycemia -Patient provided with Trulicity samples -Next A1C anticipated at the end of April 2022   ASCVD risk - primary prevention in patient with diabetes, statin indicated. Last LDL = 93. ASCVD risk score is not >20%  - moderate intensity statin indicated. Aspirin is not indicated.  -Consider statin initiation at the next visit.  Written patient instructions provided.  Total time in face to face counseling 30 minutes.   Follow up Pharmacist Clinic Visit on 06/28/2020 at 9:10 am.   Patient seen with Coralyn Helling, PharmD Candidate, and Sanda Klein, PharmD - PGY-1 Resident, and Fabio Neighbors, PharmD, BCPS - PGY2 Pharmacy Resident.

## 2020-05-30 NOTE — Progress Notes (Signed)
Reviewed: I agree with Dr. Koval's documentation and management. 

## 2020-06-28 ENCOUNTER — Ambulatory Visit: Payer: Medicaid Other | Admitting: Family Medicine

## 2020-08-02 ENCOUNTER — Ambulatory Visit: Payer: Medicaid Other | Admitting: Family Medicine

## 2020-08-26 ENCOUNTER — Other Ambulatory Visit: Payer: Self-pay

## 2020-08-26 ENCOUNTER — Encounter: Payer: Self-pay | Admitting: Family Medicine

## 2020-08-26 ENCOUNTER — Ambulatory Visit (INDEPENDENT_AMBULATORY_CARE_PROVIDER_SITE_OTHER): Payer: Medicaid Other | Admitting: Family Medicine

## 2020-08-26 VITALS — BP 130/60 | HR 74 | Ht 64.5 in | Wt 155.0 lb

## 2020-08-26 DIAGNOSIS — Z23 Encounter for immunization: Secondary | ICD-10-CM

## 2020-08-26 DIAGNOSIS — E1165 Type 2 diabetes mellitus with hyperglycemia: Secondary | ICD-10-CM

## 2020-08-26 DIAGNOSIS — E119 Type 2 diabetes mellitus without complications: Secondary | ICD-10-CM

## 2020-08-26 DIAGNOSIS — Z794 Long term (current) use of insulin: Secondary | ICD-10-CM

## 2020-08-26 LAB — POCT GLYCOSYLATED HEMOGLOBIN (HGB A1C): HbA1c, POC (controlled diabetic range): 6.6 % (ref 0.0–7.0)

## 2020-08-26 MED ORDER — TRULICITY 1.5 MG/0.5ML ~~LOC~~ SOAJ: 1.5000 mg | SUBCUTANEOUS | 3 refills | Status: DC

## 2020-08-26 MED ORDER — METFORMIN HCL ER 500 MG PO TB24: 500.0000 mg | ORAL_TABLET | Freq: Two times a day (BID) | ORAL | 3 refills | Status: DC

## 2020-08-26 MED ORDER — ATORVASTATIN CALCIUM 20 MG PO TABS: 20.0000 mg | ORAL_TABLET | Freq: Every day | ORAL | 3 refills | Status: DC

## 2020-08-26 NOTE — Patient Instructions (Signed)
It was wonderful to see you today.  Please bring ALL of your medications with you to every visit.   Today we talked about:  --Starting a new pill for cholesterol to keep you healthy---take every day at night  -- Continue all of your other medications    Congratulations on your glucose level  - ????? ?? ????? ??? ????? ?? ??????????? ?????? ??? ???? - ??????? ?? ??? ?? ?????  - ????? ?? ????? ???? ??????? ??????   ??????? ??? ????? ???????? ???? - albad' fi tanawul habat jadidat min alkulistirul lilhifaz ealaa sihatik - tanawalaha kula yawm fi allayl - astamara fi tanawul jamie al'adwiat al'ukhraa tahanina ealaa Abran Richard   Thank you for choosing  Family Medicine.   Please call [phone] with any questions about today's appointment.  Please be sure to schedule follow up at the front  desk before you leave today.   [name], MD  Family Medicine

## 2020-08-26 NOTE — Assessment & Plan Note (Signed)
Congratulated on improvement Continue medications Refilled Metformin Started statin LDL at follow up

## 2020-08-26 NOTE — Progress Notes (Signed)
    SUBJECTIVE:   CHIEF COMPLAINT: diabetes  HPI:   Destiny Walker is a 45 y.o. yo with history notable for type 2 diabetes  presenting for check up. She is with her daughter today. The patient speaks Arabic [place]) as their primary language.  An interpreter was used for the entire visit.   Type 2 diabetes The patient reports compliance with metformin and dulaglutide. She denies side effects. Reports she need refills on metformin. No polyuria, polydipsia, or hypoglycemia. She is amenable to a statin and understands this would need to be stopped if she would become pregnant.   PERTINENT  PMH / PSH/Family/Social History : Updated   OBJECTIVE:   BP 130/60   Pulse 74   Ht 5' 4.5" (1.638 m)   Wt 155 lb (70.3 kg)   LMP 08/03/2020   SpO2 95%   BMI 26.19 kg/m   Today's weight:  Last Weight  Most recent update: 08/26/2020  9:17 AM    Weight  70.3 kg (155 lb)            Review of prior weights: [employer]   08/26/20 0917  Weight: 155 lb (70.3 kg)     Cardiac: Regular rate and rhythm. Normal S1/S2. No murmurs, rubs, or gallops appreciated. Lungs: Clear bilaterally to ascultation.  Psych: Pleasant and appropriate    ASSESSMENT/PLAN:   Type 2 diabetes mellitus with hyperglycemia (HCC) Congratulated on improvement Continue medications Refilled Metformin Started statin LDL at follow up     [place] booster, Hep A, second MMR and Varicella at follow up  PCV20 given today  Refer to Ophtho at follow up Pap at follow up     Dorris Singh, [place]

## 2020-09-20 ENCOUNTER — Ambulatory Visit: Payer: Medicaid Other | Admitting: Family Medicine

## 2021-01-24 ENCOUNTER — Ambulatory Visit (INDEPENDENT_AMBULATORY_CARE_PROVIDER_SITE_OTHER): Payer: Self-pay | Admitting: Family Medicine

## 2021-01-24 ENCOUNTER — Ambulatory Visit (INDEPENDENT_AMBULATORY_CARE_PROVIDER_SITE_OTHER): Payer: Self-pay

## 2021-01-24 ENCOUNTER — Encounter: Payer: Self-pay | Admitting: Family Medicine

## 2021-01-24 ENCOUNTER — Other Ambulatory Visit: Payer: Self-pay

## 2021-01-24 VITALS — BP 110/56 | HR 105 | Ht 65.0 in | Wt 151.2 lb

## 2021-01-24 DIAGNOSIS — Z23 Encounter for immunization: Secondary | ICD-10-CM

## 2021-01-24 DIAGNOSIS — Z0000 Encounter for general adult medical examination without abnormal findings: Secondary | ICD-10-CM

## 2021-01-24 DIAGNOSIS — E119 Type 2 diabetes mellitus without complications: Secondary | ICD-10-CM

## 2021-01-24 DIAGNOSIS — E1165 Type 2 diabetes mellitus with hyperglycemia: Secondary | ICD-10-CM

## 2021-01-24 DIAGNOSIS — R000 Tachycardia, unspecified: Secondary | ICD-10-CM

## 2021-01-24 LAB — POCT GLYCOSYLATED HEMOGLOBIN (HGB A1C): HbA1c, POC (controlled diabetic range): 6.4 % (ref 0.0–7.0)

## 2021-01-24 MED ORDER — METFORMIN HCL ER 500 MG PO TB24
500.0000 mg | ORAL_TABLET | Freq: Two times a day (BID) | ORAL | 3 refills | Status: DC
Start: 2021-01-24 — End: 2021-04-24

## 2021-01-24 NOTE — Progress Notes (Signed)
    SUBJECTIVE:   CHIEF COMPLAINT: diabetes  HPI:   The patient speaks [place] Arabic as their primary language.  An interpreter was used for the entire visit.   Destiny Walker is a 45 y.o. yo with history notable for type 2 diabetes presenting for routine follow-up.  She is joined by her daughter today.  She has no specific concerns.  She is taking all her medications but wonders if she needs to continue to take her cholesterol pill.  She is adamant she does not want a Pap smear today.  She is understanding of the risk.  She specifically denies headaches, vision changes, chest pain, dyspnea on exertion, blood in her urine blood in her stools constipation or diarrhea.Marland Kitchen   PERTINENT  PMH / PSH/Family/Social History : Updated and reviewed as appropriate  OBJECTIVE:   BP (!) 110/56   Pulse (!) 105   Ht 5\' 5"  (1.651 m)   Wt 151 lb 4 oz (68.6 kg)   LMP 08/03/2020   BMI 25.17 kg/m   Today's weight:  Last Weight  Most recent update: 01/24/2021  9:57 AM    Weight  68.6 kg (151 lb 4 oz)            Review of prior weights: Filed Weights   01/24/21 0956  Weight: 151 lb 4 oz (68.6 kg)   Regular rate and rhythm no murmurs rubs or gallops.  Heart rate is 92 my exam lungs clear bilaterally to auscultation.  Abdomen is soft nontender nondistended.  There is no lower extremity edema.   ASSESSMENT/PLAN:   Type 2 diabetes mellitus with hyperglycemia (HCC) Continue metformin.  Reduce dose to 1 pill once daily.  Continue Trulicity once a week.  Recommended she continue her statin.  We will obtain direct LDL today.  Referral to ophthalmology.   Intermittent tachycardia, usually present only when she first presents.  We will obtain TSH and CBC today.  No lower extremity edema or symptoms suggestive of pulmonary embolism or other cause.  If persist consider EKG in the future.  On my repeat exam it was 92.  Healthcare maintenance, COVID and flu given today.  Annual exam - Declined Pap  -  Consider Mammogram and colonoscopy at follow up  - We discussed healthy eating habits, moderate physical activity for 30 minutes 5 times per week (or 150 minutes). Non smoker, no EtOH use.      01/26/21, MD  Family Medicine Teaching Service  [facility]

## 2021-01-24 NOTE — Assessment & Plan Note (Signed)
Continue metformin.  Reduce dose to 1 pill once daily.  Continue Trulicity once a week.  Recommended she continue her statin.  We will obtain direct LDL today.  Referral to ophthalmology.

## 2021-01-24 NOTE — Patient Instructions (Addendum)
It was wonderful to see you today.  Please bring ALL of your medications with you to every visit.   Today we talked about:  ---Take 1 metformin once a day  - I will call you with cholesterol level   - Follow up in 6 months    - ?? 1 ????????? ??? ????? ?? ?????  - ????? ?? ????? ????? ???????????  - ???????? ??? 6 ???? - khudh 1 mitfwrmin maratan wahidatan fi alyawm - sa'atasil bik bikhusus mustawaa alkulisturul - almutabaeat baed 6 shuhur   Thank you for choosing [facility].   Please call [phone] with any questions about today's appointment.  Please be sure to schedule follow up at the front  desk before you leave today.   [name], MD  Family Medicine

## 2021-01-25 LAB — CBC
Hematocrit: 41.8 % (ref 34.0–46.6)
Hemoglobin: 13.6 g/dL (ref 11.1–15.9)
MCH: 27 pg (ref 26.6–33.0)
MCHC: 32.5 g/dL (ref 31.5–35.7)
MCV: 83 fL (ref 79–97)
Platelets: 261 10*3/uL (ref 150–450)
RBC: 5.04 x10E6/uL (ref 3.77–5.28)
RDW: 13.5 % (ref 11.7–15.4)
WBC: 5.5 10*3/uL (ref 3.4–10.8)

## 2021-01-25 LAB — LDL CHOLESTEROL, DIRECT: LDL Direct: 70 mg/dL (ref 0–99)

## 2021-01-25 LAB — TSH: TSH: 2.82 u[IU]/mL (ref 0.450–4.500)

## 2021-01-29 ENCOUNTER — Encounter: Payer: Self-pay | Admitting: Family Medicine

## 2021-03-05 ENCOUNTER — Encounter: Payer: Self-pay | Admitting: *Deleted

## 2021-04-24 ENCOUNTER — Other Ambulatory Visit: Payer: Self-pay

## 2021-04-24 DIAGNOSIS — E119 Type 2 diabetes mellitus without complications: Secondary | ICD-10-CM

## 2021-04-24 MED ORDER — METFORMIN HCL ER 500 MG PO TB24: 500.0000 mg | ORAL_TABLET | Freq: Two times a day (BID) | ORAL | 3 refills | Status: DC

## 2021-05-19 ENCOUNTER — Ambulatory Visit: Payer: Medicaid Other | Admitting: Family Medicine

## 2021-08-08 ENCOUNTER — Other Ambulatory Visit: Payer: Self-pay

## 2021-08-08 ENCOUNTER — Encounter: Payer: Self-pay | Admitting: Family Medicine

## 2021-08-08 ENCOUNTER — Ambulatory Visit (INDEPENDENT_AMBULATORY_CARE_PROVIDER_SITE_OTHER): Payer: Self-pay | Admitting: Family Medicine

## 2021-08-08 VITALS — BP 152/80 | HR 95 | Wt 152.6 lb

## 2021-08-08 DIAGNOSIS — E1165 Type 2 diabetes mellitus with hyperglycemia: Secondary | ICD-10-CM

## 2021-08-08 LAB — POCT GLYCOSYLATED HEMOGLOBIN (HGB A1C): HbA1c, POC (controlled diabetic range): 6.9 % (ref 0.0–7.0)

## 2021-08-08 MED ORDER — ATORVASTATIN CALCIUM 20 MG PO TABS: 20.0000 mg | ORAL_TABLET | Freq: Every day | ORAL | 3 refills | Status: DC

## 2021-08-08 NOTE — Patient Instructions (Addendum)
It was wonderful to see you today.  Please bring ALL of your medications with you to every visit.   Today we talked about:  -You can stop your injection   I recommend you undergo a mammogram.   You can call to schedule an appointment by calling [phone].   Directions [address] [place], [place] [address]  Please let me know if you have questions. I will send you a letter or call you with results.     ????? ??????? ?????? ????? ??????? ???????.  ????? ??????? ?????? ???? ?? ???? ??????? ?????? [phone].   ????????? [address] ????????? ? ???? ????????? [id number]  ???? ???? ???? ?? ??? ???? ?????. ????? ?? ????? ?? ???? ?? ??????? ????????. - [place] altawaquf ean alhuqan '[place] bialkhudue litaswir althady bial'ashieat alsiyniati. [place] aliatisal litahdid maweid ean tariq aliatisal bialraqm [phone]. aliatijahat [address] [place] , [place] [address] fadlan duenaa 'aelam 'iin kan ladayk 'asyilatun. sa'ursil lak risalatan 'aw 'atasil bik li'iiblaghik bialnatayija.   Thank you for choosing [facility].   Please call [phone] with any questions about today's appointment.  Please be sure to schedule follow up at the front  desk before you leave today.   [name], MD  Family Medicine

## 2021-08-08 NOTE — Assessment & Plan Note (Signed)
Congratulated on her A1c.  Discontinue Trulicity.  Continue metformin.  Encouraged her to take her statin.  Checked a lipid panel today.

## 2021-08-08 NOTE — Progress Notes (Signed)
    SUBJECTIVE:   CHIEF COMPLAINT: diabetes HPI:   Destiny Walker is a 46 y.o.  with history notable for type 2 diabetes presenting for follow up.   The patient presents with her daughter today.  On interpreter is present.  The patient reports she would like to get off her injectable medication.  She will continue to take her metformin but is really working on diet and exercise and would like to get off this.  She denies polyuria or polydipsia.  She reports otherwise she is well.  She declines a Pap smear today.  Patient has elevated blood pressure today.  She denies headaches or chest pain. PERTINENT  PMH / PSH/Family/Social History : Updated and reviewed as appropriate.  OBJECTIVE:   BP (!) 152/80   Pulse 95   Wt 152 lb 9.6 oz (69.2 kg)   LMP 08/03/2020   SpO2 100%   BMI 25.39 kg/m   Today's weight:  Last Weight  Most recent update: 08/08/2021  9:55 AM    Weight  69.2 kg (152 lb 9.6 oz)            Review of prior weights: [employer]   08/08/21 0954  Weight: 152 lb 9.6 oz (69.2 kg)     Cardiac: Regular rate and rhythm. Normal S1/S2. No murmurs, rubs, or gallops appreciated. Lungs: Clear bilaterally to ascultation.  Psych: Pleasant and appropriate    ASSESSMENT/PLAN:   Type 2 diabetes mellitus with hyperglycemia (HCC) Congratulated on her A1c.  Discontinue Trulicity.  Continue metformin.  Encouraged her to take her statin.  Checked a lipid panel today.   Healthcare maintenance discussed in great detail Pap smear she will return for this in a few months.  Also will complete foot exam at this time as patient wears stockings to visits given her cultural preference. Mammogram ordered today.  Discussed how to call and obtain this.  Elevated BP reading persistently elevated blood pressure readings, given her history of diabetes would benefit from an ARB.  She is very hesitant to start a medication today and given that I do not have outside readings we will ask her to  return for 24-hour blood pressure monitoring this was scheduled.    Destiny Starr, MD  Family Medicine Teaching Service  [facility]

## 2021-08-09 LAB — LIPID PANEL
Chol/HDL Ratio: 3.6 ratio (ref 0.0–4.4)
Cholesterol, Total: 155 mg/dL (ref 100–199)
HDL: 43 mg/dL (ref >39)
LDL Chol Calc (NIH): 89 mg/dL (ref 0–99)
Triglycerides: 130 mg/dL (ref 0–149)
VLDL Cholesterol Cal: 23 mg/dL (ref 5–40)

## 2021-08-10 LAB — MICROALBUMIN / CREATININE URINE RATIO
Creatinine, Urine: 65.9 mg/dL
Microalb/Creat Ratio: <5 mg/g creat (ref 0–29)
Microalbumin, Urine: <3 ug/mL

## 2021-08-11 ENCOUNTER — Telehealth: Payer: Self-pay | Admitting: Family Medicine

## 2021-08-11 NOTE — Telephone Encounter (Signed)
The patient speaks Arabic as their primary language.  An interpreter was used for the entire visit.   Called with results---she is to continue statin therapy.   [name], MD  Family Medicine Teaching Service

## 2021-08-21 ENCOUNTER — Ambulatory Visit: Payer: Medicaid Other | Admitting: Pharmacist

## 2021-11-25 ENCOUNTER — Ambulatory Visit (INDEPENDENT_AMBULATORY_CARE_PROVIDER_SITE_OTHER): Payer: Self-pay | Admitting: Family Medicine

## 2021-11-25 ENCOUNTER — Encounter: Payer: Self-pay | Admitting: Family Medicine

## 2021-11-25 ENCOUNTER — Other Ambulatory Visit: Payer: Self-pay

## 2021-11-25 VITALS — BP 135/82 | Wt 150.8 lb

## 2021-11-25 DIAGNOSIS — E1165 Type 2 diabetes mellitus with hyperglycemia: Secondary | ICD-10-CM

## 2021-11-25 DIAGNOSIS — E785 Hyperlipidemia, unspecified: Secondary | ICD-10-CM

## 2021-11-25 DIAGNOSIS — E119 Type 2 diabetes mellitus without complications: Secondary | ICD-10-CM

## 2021-11-25 LAB — POCT GLYCOSYLATED HEMOGLOBIN (HGB A1C): HbA1c, POC (controlled diabetic range): 9.4 % — AB (ref 0.0–7.0)

## 2021-11-25 MED ORDER — ATORVASTATIN CALCIUM 20 MG PO TABS: 20.0000 mg | ORAL_TABLET | Freq: Every day | ORAL | 3 refills | Status: DC

## 2021-11-25 MED ORDER — METFORMIN HCL ER 500 MG PO TB24: 500.0000 mg | ORAL_TABLET | Freq: Two times a day (BID) | ORAL | 3 refills | Status: DC

## 2021-11-25 NOTE — Patient Instructions (Addendum)
It was wonderful to see you today.  Please bring ALL of your medications with you to every visit.   Today we talked about:   - Start metformin 2 tablets 500 mg twice  a day  - Start a pill for cholesterol- Lipitor 20 mg at night   - ????? ?????? ??????????? 2 ??? 500 ??? ????? ??????  - ????? ?????? ???? ?????????? - ??????? 20 ??? ????? - albad' bitanawul almitfurmin 2 qurs 500 malaghi maratayn ywmyaan - albad' bitanawul hubub alkulistirul - libitur 20 milgh lylaan    Thank you for choosing [facility].   Please call [phone] with any questions about today's appointment.  Please be sure to schedule follow up at the front  desk before you leave today.   Dorris Singh, MD  Family Medicine

## 2021-11-25 NOTE — Assessment & Plan Note (Signed)
A1C today elevated Restart metformin BID Will restart Trulicity at next visit Discussed refill process at length BMP today   Restart atorvastatin 20 mg

## 2021-11-25 NOTE — Progress Notes (Signed)
    SUBJECTIVE:   CHIEF COMPLAINT: diabetes HPI:  The patient speaks Arabic as their primary language.  An interpreter was used for the entire visit.   Destiny Walker is a 46 y.o.  with history notable for type 2 diabetes presenting for follow up.  Since her last visit, the patient has been off all of her medication. She endorses eating more sweets. She stopped taking her medications as the bottles were empty.  Denies polyuria, polydipsia, or vaginal symptoms. She walks at least a mile each day. No nausea or vomiting.   The patient is otherwise doing well. She is not working currently.   PERTINENT  PMH / PSH/Family/Social History : Type 2 DM   OBJECTIVE:   BP 135/82   Wt 150 lb 12.8 oz (68.4 kg)   LMP 08/03/2020   BMI 25.09 kg/m   Today's weight:  Last Weight  Most recent update: 11/25/2021 10:05 AM    Weight  68.4 kg (150 lb 12.8 oz)            Review of prior weights: Filed Weights   11/25/21 1005  Weight: 150 lb 12.8 oz (68.4 kg)     Cardiac: Regular rate and rhythm. Normal S1/S2. No murmurs, rubs, or gallops appreciated. Lungs: Clear bilaterally to ascultation.  Abdomen: Normoactive bowel sounds. No tenderness to deep or light palpation. No rebound or guarding.   Psych: Pleasant and appropriate    ASSESSMENT/PLAN:   Type 2 diabetes mellitus with hyperglycemia (HCC) A1C today elevated Restart metformin BID Will restart Trulicity at next visit Discussed refill process at length BMP today   Restart atorvastatin 20 mg    HCM Declined influenza     Dorris Singh, MD  [place]

## 2021-11-26 LAB — BASIC METABOLIC PANEL
BUN/Creatinine Ratio: 24 — ABNORMAL HIGH (ref 9–23)
BUN: 16 mg/dL (ref 6–24)
CO2: 22 mmol/L (ref 20–29)
Calcium: 9.5 mg/dL (ref 8.7–10.2)
Chloride: 102 mmol/L (ref 96–106)
Creatinine, Ser: 0.66 mg/dL (ref 0.57–1.00)
Glucose: 219 mg/dL — ABNORMAL HIGH (ref 70–99)
Potassium: 3.9 mmol/L (ref 3.5–5.2)
Sodium: 139 mmol/L (ref 134–144)
eGFR: 109 mL/min/{1.73_m2} (ref >59)

## 2021-11-27 ENCOUNTER — Encounter: Payer: Self-pay | Admitting: Family Medicine

## 2022-01-05 ENCOUNTER — Ambulatory Visit: Payer: Medicaid Other | Admitting: Family Medicine

## 2022-01-05 NOTE — Progress Notes (Deleted)
    SUBJECTIVE:   CHIEF COMPLAINT: *** HPI:  The patient speaks Arabic as their primary language.  An interpreter was used for the entire visit.   Destiny Walker is a 46 y.o.  with history notable for *** presenting for ***.   PERTINENT  PMH / PSH/Family/Social History : ***  OBJECTIVE:   LMP 08/03/2020   Today's weight:  Review of prior weights: There were no vitals filed for this visit.  ***  ASSESSMENT/PLAN:   No problem-specific Assessment & Plan notes found for this encounter.     {    This will disappear when note is signed, click to select method of visit    :1}  Dorris Singh, [place]

## 2022-11-22 ENCOUNTER — Encounter (HOSPITAL_COMMUNITY): Payer: Self-pay | Admitting: Emergency Medicine

## 2022-11-22 ENCOUNTER — Emergency Department (HOSPITAL_COMMUNITY)
Admission: EM | Admit: 2022-11-22 | Discharge: 2022-11-22 | Disposition: A | Discharge status: Home / Self Care | Payer: Medicaid Other | Attending: Emergency Medicine | Admitting: Emergency Medicine

## 2022-11-22 ENCOUNTER — Other Ambulatory Visit: Payer: Self-pay

## 2022-11-22 DIAGNOSIS — E119 Type 2 diabetes mellitus without complications: Secondary | ICD-10-CM

## 2022-11-22 DIAGNOSIS — Z7984 Long term (current) use of oral hypoglycemic drugs: Secondary | ICD-10-CM | POA: Insufficient documentation

## 2022-11-22 DIAGNOSIS — R739 Hyperglycemia, unspecified: Secondary | ICD-10-CM

## 2022-11-22 DIAGNOSIS — E1165 Type 2 diabetes mellitus with hyperglycemia: Secondary | ICD-10-CM | POA: Insufficient documentation

## 2022-11-22 LAB — I-STAT VENOUS BLOOD GAS, ED
Acid-Base Excess: 2 mmol/L (ref 0.0–2.0)
Bicarbonate: 25.6 mmol/L (ref 20.0–28.0)
Calcium, Ion: 1.06 mmol/L — ABNORMAL LOW (ref 1.15–1.40)
HCT: 42 % (ref 36.0–46.0)
Hemoglobin: 14.3 g/dL (ref 12.0–15.0)
O2 Saturation: 83 %
Patient temperature: 37
Potassium: 3.6 mmol/L (ref 3.5–5.1)
Sodium: 129 mmol/L — ABNORMAL LOW (ref 135–145)
TCO2: 27 mmol/L (ref 22–32)
pCO2, Ven: 36.3 mmHg — ABNORMAL LOW (ref 44–60)
pH, Ven: 7.455 — ABNORMAL HIGH (ref 7.25–7.43)
pO2, Ven: 45 mmHg (ref 32–45)

## 2022-11-22 LAB — CBC WITH DIFFERENTIAL/PLATELET
Abs Immature Granulocytes: 0.03 10*3/uL (ref 0.00–0.07)
Basophils Absolute: 0 10*3/uL (ref 0.0–0.1)
Basophils Relative: 0 %
Eosinophils Absolute: 0 10*3/uL (ref 0.0–0.5)
Eosinophils Relative: 0 %
HCT: 40.6 % (ref 36.0–46.0)
Hemoglobin: 13.7 g/dL (ref 12.0–15.0)
Immature Granulocytes: 0 %
Lymphocytes Relative: 42 %
Lymphs Abs: 3.8 10*3/uL (ref 0.7–4.0)
MCH: 27.4 pg (ref 26.0–34.0)
MCHC: 33.7 g/dL (ref 30.0–36.0)
MCV: 81.2 fL (ref 80.0–100.0)
Monocytes Absolute: 0.5 10*3/uL (ref 0.1–1.0)
Monocytes Relative: 5 %
Neutro Abs: 4.6 10*3/uL (ref 1.7–7.7)
Neutrophils Relative %: 53 %
Platelets: 224 10*3/uL (ref 150–400)
RBC: 5 MIL/uL (ref 3.87–5.11)
RDW: 12.7 % (ref 11.5–15.5)
WBC: 8.9 10*3/uL (ref 4.0–10.5)
nRBC: 0.2 % (ref 0.0–0.2)

## 2022-11-22 LAB — URINALYSIS, ROUTINE W REFLEX MICROSCOPIC
Bilirubin Urine: NEGATIVE
Glucose, UA: >=500 mg/dL — AB
Hgb urine dipstick: NEGATIVE
Ketones, ur: NEGATIVE mg/dL
Leukocytes,Ua: NEGATIVE
Nitrite: NEGATIVE
Protein, ur: NEGATIVE mg/dL
Specific Gravity, Urine: <1.005 — ABNORMAL LOW (ref 1.005–1.030)
pH: 6 (ref 5.0–8.0)

## 2022-11-22 LAB — URINALYSIS, MICROSCOPIC (REFLEX): Bacteria, UA: NONE SEEN

## 2022-11-22 LAB — CBG MONITORING, ED
Glucose-Capillary: 581 mg/dL (ref 70–99)
Glucose-Capillary: 506 mg/dL (ref 70–99)

## 2022-11-22 LAB — COMPREHENSIVE METABOLIC PANEL
ALT: 36 U/L (ref 0–44)
AST: 30 U/L (ref 15–41)
Albumin: 3.9 g/dL (ref 3.5–5.0)
Alkaline Phosphatase: 80 U/L (ref 38–126)
Anion gap: 13 (ref 5–15)
BUN: 17 mg/dL (ref 6–20)
CO2: 22 mmol/L (ref 22–32)
Calcium: 8.8 mg/dL — ABNORMAL LOW (ref 8.9–10.3)
Chloride: 92 mmol/L — ABNORMAL LOW (ref 98–111)
Creatinine, Ser: 0.85 mg/dL (ref 0.44–1.00)
GFR, Estimated: >60 mL/min (ref >60)
Glucose, Bld: 558 mg/dL (ref 70–99)
Potassium: 3.7 mmol/L (ref 3.5–5.1)
Sodium: 127 mmol/L — ABNORMAL LOW (ref 135–145)
Total Bilirubin: 0.6 mg/dL (ref 0.3–1.2)
Total Protein: 7.1 g/dL (ref 6.5–8.1)

## 2022-11-22 LAB — PREGNANCY, URINE: Preg Test, Ur: NEGATIVE

## 2022-11-22 MED ORDER — METFORMIN HCL 500 MG PO TABS
500.0000 mg | ORAL_TABLET | Freq: Once | ORAL | Status: AC
  Administered 2022-11-22: 500 mg via ORAL
  Filled 2022-11-22: qty 1

## 2022-11-22 MED ORDER — METFORMIN HCL ER 500 MG PO TB24: 500.0000 mg | ORAL_TABLET | Freq: Two times a day (BID) | ORAL | 0 refills | Status: DC

## 2022-11-22 MED ORDER — SODIUM CHLORIDE 0.9 % IV BOLUS
500.0000 mL | Freq: Once | INTRAVENOUS | Status: AC
  Administered 2022-11-22: 500 mL via INTRAVENOUS

## 2022-11-22 NOTE — ED Notes (Signed)
Blood sugar 506

## 2022-11-22 NOTE — ED Provider Notes (Signed)
[place] EMERGENCY DEPARTMENT AT [facility] Provider Note  CSN: [id number] Arrival date & time: 11/22/22 0450  Chief Complaint(s) Hyperglycemia  HPI Destiny Walker is a 47 y.o. female with a past medical history listed below including type 2 diabetes on metformin who presents to the emergency department for CBG readings of high.  Patient states that she has been out of her medication for at least a month.  Reports that today she had dietary intake and high sugars stating she had lots of sweets as well as coffee with several spoons of sugar.  Denies any other physical complaints including chest pain, shortness of breath, nausea, vomiting, abdominal pain, dysuria, frequency or urgency.  The history is provided by the patient.    Past Medical History Past Medical History:  Diagnosis Date   Type 2 diabetes mellitus (HCC)    Patient Active Problem List   Diagnosis Date Noted   Type 2 diabetes mellitus with hyperglycemia (HCC) 11/09/2018   Menorrhagia 06/29/2014   Home Medication(s) Prior to Admission medications   Medication Sig Start Date End Date Taking? Authorizing Provider  Accu-Chek Softclix Lancets lancets Use as instructed Patient not taking: Reported on 11/25/2021 04/05/20   Westley Chandler, MD  atorvastatin (LIPITOR) 20 MG tablet Take 1 tablet (20 mg total) by mouth at bedtime. 11/25/21   Westley Chandler, MD  Blood Glucose Monitoring Suppl (ACCU-CHEK AVIVA PLUS) w/Device KIT Use to check blood sugar once daily Patient not taking: Reported on 11/25/2021 04/05/20   Westley Chandler, MD  glucose blood (ACCU-CHEK AVIVA PLUS) test strip Use as instructed Patient not taking: Reported on 11/25/2021 04/05/20   Westley Chandler, MD  Insulin Pen Needle 32G X 4 MM MISC Use to inject once daily Patient not taking: Reported on 11/25/2021 04/05/20   Westley Chandler, MD  Lancets (ACCU-CHEK SOFT [facility]) lancets Use as instructed Patient not taking: Reported on 11/25/2021 11/08/18   Marthenia Rolling, DO  metFORMIN (GLUCOPHAGE-XR) 500 MG 24 hr tablet Take 1 tablet (500 mg total) by mouth 2 (two) times daily with a meal. 11/22/22 01/21/23  Gizella Belleville, Amadeo Garnet, MD                                                                                                                                    Allergies Patient has no known allergies.  Review of Systems Review of Systems As noted in HPI  Physical Exam Vital Signs  I have reviewed the triage vital signs BP 137/87 (BP Location: Right Arm)   Pulse 98   Temp 98.4 F (36.9 C)   Resp 16   Wt 81.6 kg   LMP 08/03/2020   SpO2 100%   BMI 29.95 kg/m   Physical Exam Vitals reviewed.  Constitutional:      General: She is not in acute distress.    Appearance: She is well-developed. She is not diaphoretic.  HENT:  Head: Normocephalic and atraumatic.     Right Ear: External ear normal.     Left Ear: External ear normal.     Nose: Nose normal.  Eyes:     General: No scleral icterus.    Conjunctiva/sclera: Conjunctivae normal.  Neck:     Trachea: Phonation normal.  Cardiovascular:     Rate and Rhythm: Normal rate and regular rhythm.  Pulmonary:     Effort: Pulmonary effort is normal. No respiratory distress.     Breath sounds: No stridor.  Abdominal:     General: There is no distension.     Tenderness: There is no abdominal tenderness.  Musculoskeletal:        General: Normal range of motion.     Cervical back: Normal range of motion.  Neurological:     Mental Status: She is alert and oriented to person, place, and time.  Psychiatric:        Behavior: Behavior normal.     ED Results and Treatments Labs (all labs ordered are listed, but only abnormal results are displayed) Labs Reviewed  COMPREHENSIVE METABOLIC PANEL - Abnormal; Notable for the following components:      Result Value   Sodium 127 (*)    Chloride 92 (*)    Glucose, Bld 558 (*)    Calcium 8.8 (*)    All other components within normal limits   URINALYSIS, ROUTINE W REFLEX MICROSCOPIC - Abnormal; Notable for the following components:   Color, Urine STRAW (*)    Specific Gravity, Urine <1.005 (*)    Glucose, UA >=500 (*)    All other components within normal limits  CBG MONITORING, ED - Abnormal; Notable for the following components:   Glucose-Capillary 581 (*)    All other components within normal limits  I-STAT VENOUS BLOOD GAS, ED - Abnormal; Notable for the following components:   pH, Ven 7.455 (*)    pCO2, Ven 36.3 (*)    Sodium 129 (*)    Calcium, Ion 1.06 (*)    All other components within normal limits  CBG MONITORING, ED - Abnormal; Notable for the following components:   Glucose-Capillary 506 (*)    All other components within normal limits  CBC WITH DIFFERENTIAL/PLATELET  PREGNANCY, URINE  URINALYSIS, MICROSCOPIC (REFLEX)                                                                                                                         EKG  EKG Interpretation Date/Time:    Ventricular Rate:    PR Interval:    QRS Duration:    QT Interval:    QTC Calculation:   R Axis:      Text Interpretation:         Radiology No results found.  Medications Ordered in ED Medications  metFORMIN (GLUCOPHAGE) tablet 500 mg (500 mg Oral Given 11/22/22 0551)  sodium chloride 0.9 % bolus 500 mL (0 mLs Intravenous Stopped 11/22/22 0657)   Procedures Procedures  (  including critical care time) Medical Decision Making / ED Course   Medical Decision Making Amount and/or Complexity of Data Reviewed Labs: ordered.  Risk Prescription drug management.    Hyperglycemia due to noncompliance with dietary changes and not have the medication.  No other physical complaints.  Labs notable for hyperglycemia without evidence of DKA.  No other electrolyte or metabolic complications.  Patient provided with a dose of metformin.  Given IV fluids.  CBG downtrending.  Prescribed 32-month course of metformin and recommended she  follow-up closely with her primary care provider.    Final Clinical Impression(s) / ED Diagnoses Final diagnoses:  Hyperglycemia   The patient appears reasonably screened and/or stabilized for discharge and I doubt any other medical condition or other [facility] requiring further screening, evaluation, or treatment in the ED at this time. I have discussed the findings, Dx and Tx plan with the patient/family who expressed understanding and agree(s) with the plan. Discharge instructions discussed at length. The patient/family was given strict return precautions who verbalized understanding of the instructions. No further questions at time of discharge.  Disposition: Discharge  Condition: Good  ED Discharge Orders          Ordered    metFORMIN (GLUCOPHAGE-XR) 500 MG 24 hr tablet  2 times daily with meals        11/22/22 0731             Follow Up: Westley Chandler, MD [address] [place] [address] [phone]  Call  to schedule an appointment for close follow up    This chart was dictated using voice recognition software.  Despite best efforts to proofread,  errors can occur which can change the documentation meaning.    Nira Conn, MD 11/22/22 1901

## 2022-11-22 NOTE — ED Notes (Signed)
Patient stated she has not taken her oral diabetic medication for 1 month .

## 2022-11-22 NOTE — ED Triage Notes (Signed)
Patient reports her glucose has been HIGH at home.  Patient takes PO diabetes medications and has not missed any doses.

## 2022-11-24 ENCOUNTER — Other Ambulatory Visit: Payer: Self-pay | Admitting: *Deleted

## 2022-11-24 DIAGNOSIS — E785 Hyperlipidemia, unspecified: Secondary | ICD-10-CM

## 2022-11-24 MED ORDER — ATORVASTATIN CALCIUM 20 MG PO TABS
20.0000 mg | ORAL_TABLET | Freq: Every day | ORAL | 3 refills | Status: DC
Start: 2022-11-24 — End: 2023-02-22

## 2022-12-10 NOTE — Progress Notes (Unsigned)
No show  Sales executive team to reschedule Destiny Starr, MD  [facility] Medicine Teaching Service

## 2022-12-14 ENCOUNTER — Encounter (INDEPENDENT_AMBULATORY_CARE_PROVIDER_SITE_OTHER): Payer: Self-pay | Admitting: Family Medicine

## 2022-12-14 ENCOUNTER — Encounter: Payer: Self-pay | Admitting: Family Medicine

## 2022-12-14 DIAGNOSIS — E1165 Type 2 diabetes mellitus with hyperglycemia: Secondary | ICD-10-CM

## 2022-12-14 DIAGNOSIS — Z794 Long term (current) use of insulin: Secondary | ICD-10-CM

## 2023-02-19 NOTE — Progress Notes (Unsigned)
SUBJECTIVE:   CHIEF COMPLAINT: diabetes  HPI:   Destiny Walker is a 47 y.o.  with history notable for type 2 DM  presenting for follow up The patient speaks Arabic as their primary language.  An interpreter was used for the entire visit.   The patient recently returned from [place].  She has been in [place] visiting family and going on a travel trip for the past 3 months.  She has been taking none of her medications.  She reports she feels very well overall.  Diabetes The patient denies polyuria polydipsia.  Her A1c is greater than 14.  We discussed insulin at length.  She is adamant she does not need this.  She has previously successfully gotten her A1c from 14-7 with dietary changes and metformin alone.  She denies nausea, vomiting, polyuria polydipsia.  The patient reports she sexually active with her husband.  She declines reception.  She is aware of the risk of pregnancy.  She is amenable to a prenatal.  The patient currently has no insurance.  She is studying for her [place] language course.  PERTINENT  PMH / PSH/Family/Social History : type 2 DM   OBJECTIVE:   BP 124/81   Pulse 98   Ht 5\' 5"  (1.651 m)   Wt 137 lb 11 oz (62.5 kg)   LMP 08/03/2020   SpO2 98%   BMI 22.91 kg/m   Today's weight:  Last Weight  Most recent update: 02/22/2023  8:57 AM    Weight  62.5 kg (137 lb 11 oz)            Review of prior weights: Filed Weights   02/22/23 0857  Weight: 137 lb 11 oz (62.5 kg)  . Cardiac: Regular rate and rhythm. Normal S1/S2. No murmurs, rubs, or gallops appreciated. Lungs: Clear bilaterally to ascultation.  Abdomen: Normoactive bowel sounds. No tenderness to deep or light palpation. No rebound or guarding.    Psych: Pleasant and appropriate    ASSESSMENT/PLAN:   Assessment & Plan Type 2 diabetes mellitus with hyperglycemia, without long-term current use of insulin (HCC) Discussed insulin at length.  She declined at this point she is going to  make dietary changes.  Restart metformin sent to pharmacy.  We discussed reasons to call and return to care including signs and symptoms of ketoacidosis.  We discussed the importance of reducing stroke, blindness, neuropathy and heart attacks with better glucose control.  Refilled lancets test strips and sent in a new glucometer.  She will check her sugars in the morning before she eats.  She can be very diligent with these things and anticipates she will bring a log to follow-up.  We discussed this at length with the interpreter.  Also obtain lipid panel, BMP UACR today.  At follow-up she needs a foot exam.  I referred her to ophthalmology. Dyslipidemia Refilled her statin Financial difficulties Referral to [facility] for assistance with applying to Medicaid.  We discussed Pap again today.  She would prefer to defer at this time.  She is quite conservative.  Follow-up scheduled up with me to discuss Pap and other healthcare maintenance issues.  She declined a flu shot today.  At her visit on January 3 please review her blood glucose log.  She is very good about checking her sugars and is to bring the values with her to her next visit.  Please increase her metformin and consider adding a GLP-1 pending her values.  [name], MD  Family Medicine Teaching Service  [facility]

## 2023-02-22 ENCOUNTER — Telehealth: Payer: Self-pay | Admitting: *Deleted

## 2023-02-22 ENCOUNTER — Ambulatory Visit (INDEPENDENT_AMBULATORY_CARE_PROVIDER_SITE_OTHER): Payer: Self-pay | Admitting: Family Medicine

## 2023-02-22 ENCOUNTER — Encounter: Payer: Self-pay | Admitting: Family Medicine

## 2023-02-22 VITALS — BP 124/81 | HR 98 | Ht 65.0 in | Wt 137.7 lb

## 2023-02-22 DIAGNOSIS — E785 Hyperlipidemia, unspecified: Secondary | ICD-10-CM

## 2023-02-22 DIAGNOSIS — E1165 Type 2 diabetes mellitus with hyperglycemia: Secondary | ICD-10-CM

## 2023-02-22 DIAGNOSIS — Z599 Problem related to housing and economic circumstances, unspecified: Secondary | ICD-10-CM

## 2023-02-22 DIAGNOSIS — Z7984 Long term (current) use of oral hypoglycemic drugs: Secondary | ICD-10-CM

## 2023-02-22 DIAGNOSIS — Z309 Encounter for contraceptive management, unspecified: Secondary | ICD-10-CM

## 2023-02-22 LAB — POCT GLYCOSYLATED HEMOGLOBIN (HGB A1C): HbA1c, POC (controlled diabetic range): 14.8 % — AB (ref 0.0–7.0)

## 2023-02-22 MED ORDER — METFORMIN HCL ER 500 MG PO TB24: 500.0000 mg | ORAL_TABLET | Freq: Two times a day (BID) | ORAL | 3 refills | Status: AC

## 2023-02-22 MED ORDER — ACCU-CHEK AVIVA PLUS W/DEVICE KIT: PACK | 0 refills | Status: AC

## 2023-02-22 MED ORDER — FREESTYLE LANCETS MISC: 12 refills | Status: DC

## 2023-02-22 MED ORDER — ACCU-CHEK AVIVA PLUS VI STRP: ORAL_STRIP | 12 refills | Status: DC

## 2023-02-22 MED ORDER — PRENATAL 19 PO CHEW: 1.0000 | CHEWABLE_TABLET | Freq: Every day | ORAL | 3 refills | Status: AC

## 2023-02-22 MED ORDER — ATORVASTATIN CALCIUM 20 MG PO TABS: 20.0000 mg | ORAL_TABLET | Freq: Every day | ORAL | 3 refills | Status: AC

## 2023-02-22 NOTE — Progress Notes (Unsigned)
  Care Coordination  Outreach Note  02/22/2023 Name: Destiny Walker MRN: [id number] DOB: February 07, 1976   Care Coordination Outreach Attempts: An unsuccessful telephone outreach was attempted today to offer the patient information about available care coordination services.  Follow Up Plan:  Additional outreach attempts will be made to offer the patient care coordination information and services.   Encounter Outcome:  No Answer  Gwenevere Ghazi  Care Coordination Care Guide  Direct Dial: [phone]

## 2023-02-22 NOTE — Telephone Encounter (Signed)
Rx request for softclix lancets and accu-check guide test strips. Per pharmacy "plan requires specific directions to process prescription an dplease send wit frequency of how pt will be using and days/supply limitations. Muhammed Teutsch Bruna Potter, CMA

## 2023-02-22 NOTE — Assessment & Plan Note (Signed)
Discussed insulin at length.  She declined at this point she is going to make dietary changes.  Restart metformin sent to pharmacy.  We discussed reasons to call and return to care including signs and symptoms of ketoacidosis.  We discussed the importance of reducing stroke, blindness, neuropathy and heart attacks with better glucose control.  Refilled lancets test strips and sent in a new glucometer.  She will check her sugars in the morning before she eats.  She can be very diligent with these things and anticipates she will bring a log to follow-up.  We discussed this at length with the interpreter.  Also obtain lipid panel, BMP UACR today.  At follow-up she needs a foot exam.  I referred her to ophthalmology.

## 2023-02-22 NOTE — Patient Instructions (Addendum)
It was wonderful to see you today.  Please bring ALL of your medications with you to every visit.   Today we talked about:  - Someone will call you about medicaid  - I will call you with results  - I sent your medications to your pharmacy  Please take the metformin twice a day--we will increase at your next visit  Please reduce sugary items and increase activity  Please restart your atorvastatin    At your next visit we will complete your Pap smear   You need to see an eye doctor  They will call you about this    Please follow up in 2-3 weeks    Thank you for choosing [facility].   Please call [phone] with any questions about today's appointment.  Please be sure to schedule follow up at the front  desk before you leave today.   [name], MD  Family Medicine    ??? ?? ?????? ????? ?????.  ???? ????? ???? ?????? ??? ?? ?? ?????.   ?????? ????? ??:  - ??? ???? ?? ??? ?? ???? ??????? ??????  - ????? ?? ?? ???????  - ??? ????? ??????? ?????? ?? ??? ???????? ?????? ??  ???? ????? ??????????? ????? ?? ????? - ???? ???? ?? ?????? ???????  ???? ??????? ?? ????? ???????? ?????? ??????  ???? ????? ????? ????????????    ?? ?????? ??????? ??? ???? ?????? ???? ??? ????? ?????? ??   ??? ????? ????? ???? ??????  ??? ?????? ?? ???? ???    ???? ???????? ???? 2-3 ??????    ????? ?? ??? ?????? [facility].   ???? ??????? ?????? [phone] ??? ???? ???? ?? ????? ??? ???? ?????.  ???? ?????? ?? ????? ???? ???????? ?? ???? ????????? ??? ??????? ?????.   ?????? ?????? ????? ?? ????  ?? ?????? kan min alraayie ruyatuk alyawma. yurjaa 'iihdar jamie 'adwiatik maeak fi kuli ziaratin. tahadathna alyawm ean: - sawf yatasil bik shakhs ma bishan almaeunat altibiya - sa'atasil bik mae alnatayij - laqad 'arsilat al'adwiat alkhasat bik 'iilaa alsaydaliat alkhasat bik yurjaa tanawul almitafurmin maratayn fi alyawm - wasawf nazid  fi ziaratik alqadima yurjaa altaqlil min tanawul alsukariaat waziadat alnashat yurjaa 'iieadat tashghil 'aturfastatin fi ziaratik alqadimat sawf naqum bi'iikmal mashat eunuq alrahim alkhasat bik 'ant bihajat liruyat tabib aleuyun sawf yatasilun bik bishan hadha yurjaa almutabaeat khilal 2-3 'asabie shkran lak ealaa [facility]. [name] [phone] 'iidha kanat ladayk 'ayu 'asyilat hawl maweid alyawmi. yurjaa alta'akud min tahdid [employer] lilmutabaeat fi maktab aliaistiqbal qabl mughadaratik alyawma. karina brawn, duktur fi altibi tibu al'usra

## 2023-02-23 LAB — CBC
Hematocrit: 46.5 % (ref 34.0–46.6)
Hemoglobin: 14.9 g/dL (ref 11.1–15.9)
MCH: 27.9 pg (ref 26.6–33.0)
MCHC: 32 g/dL (ref 31.5–35.7)
MCV: 87 fL (ref 79–97)
Platelets: 250 10*3/uL (ref 150–450)
RBC: 5.34 x10E6/uL — ABNORMAL HIGH (ref 3.77–5.28)
RDW: 12.3 % (ref 11.7–15.4)
WBC: 5.3 10*3/uL (ref 3.4–10.8)

## 2023-02-23 LAB — MICROALBUMIN / CREATININE URINE RATIO
Creatinine, Urine: 31.1 mg/dL
Microalb/Creat Ratio: 195 mg/g{creat} — ABNORMAL HIGH (ref 0–29)
Microalbumin, Urine: 60.8 ug/mL

## 2023-02-23 LAB — BASIC METABOLIC PANEL
BUN/Creatinine Ratio: 17 (ref 9–23)
BUN: 11 mg/dL (ref 6–24)
CO2: 20 mmol/L (ref 20–29)
Calcium: 9.9 mg/dL (ref 8.7–10.2)
Chloride: 103 mmol/L (ref 96–106)
Creatinine, Ser: 0.63 mg/dL (ref 0.57–1.00)
Glucose: 356 mg/dL — ABNORMAL HIGH (ref 70–99)
Potassium: 4.1 mmol/L (ref 3.5–5.2)
Sodium: 140 mmol/L (ref 134–144)
eGFR: 109 mL/min/{1.73_m2} (ref >59)

## 2023-02-23 LAB — LIPID PANEL
Chol/HDL Ratio: 3.5 {ratio} (ref 0.0–4.4)
Cholesterol, Total: 164 mg/dL (ref 100–199)
HDL: 47 mg/dL (ref >39)
LDL Chol Calc (NIH): 85 mg/dL (ref 0–99)
Triglycerides: 189 mg/dL — ABNORMAL HIGH (ref 0–149)
VLDL Cholesterol Cal: 32 mg/dL (ref 5–40)

## 2023-02-23 MED ORDER — ACCU-CHEK AVIVA PLUS VI STRP: ORAL_STRIP | 3 refills | Status: AC

## 2023-02-23 MED ORDER — FREESTYLE LANCETS MISC: 3 refills | Status: AC

## 2023-02-23 NOTE — Telephone Encounter (Signed)
Updated Rx to pharmacy

## 2023-02-24 ENCOUNTER — Encounter: Payer: Self-pay | Admitting: Family Medicine

## 2023-02-24 NOTE — Progress Notes (Unsigned)
Complex Care Management  Outreach Note  02/24/2023 Name: Destiny Walker MRN: [id number] DOB: [birth year]   Complex Care Management Outreach Attempts: A second unsuccessful outreach was attempted today to offer the patient with information about available complex care management services.  Follow Up Plan:  Additional outreach attempts will be made to offer the patient complex care management information and services.   Encounter Outcome:  No Answer  Gwenevere Ghazi  Care Coordination Care Guide  Direct Dial: [phone]

## 2023-02-25 NOTE — Progress Notes (Signed)
Complex Care Management  Outreach Note  02/25/2023 Name: Destiny Walker MRN: [id number] DOB: 12-Feb-1976   Complex Care Management Outreach Attempts: A third unsuccessful outreach was attempted today to offer the patient with information about available complex care management services.  Follow Up Plan:  No further outreach attempts will be made at this time. We have been unable to contact the patient to offer or enroll patient in complex care management services.  Encounter Outcome:  No Answer  Gwenevere Ghazi  Care Coordination Care Guide  Direct Dial: [phone]

## 2023-03-12 ENCOUNTER — Ambulatory Visit: Payer: Self-pay | Admitting: Family Medicine

## 2023-03-12 NOTE — Progress Notes (Deleted)
    SUBJECTIVE:   CHIEF COMPLAINT / HPI:   T2DM- at last appt refused insulin . Started back on metformin  500mg  BID. A1c 14.8 on 02/22/23.   PERTINENT  PMH / PSH: ***  OBJECTIVE:   LMP 08/03/2020   General: A&O, NAD HEENT: No sign of trauma, EOM grossly intact Cardiac: RRR, no m/r/g Respiratory: CTAB, normal WOB, no w/c/r GI: Soft, NTTP, non-distended  Extremities: NTTP, no peripheral edema. Neuro: Normal gait, moves all four extremities appropriately. Psych: Appropriate mood and affect   ASSESSMENT/PLAN:   Assessment & Plan      Rollene FORBES Keeling, MD [facility]

## 2023-04-01 NOTE — Progress Notes (Deleted)
    SUBJECTIVE:   CHIEF COMPLAINT: diabetes HPI:   Destiny Walker is a 48 y.o.  with history notable for type 2 DM uncontrolled and not on insulin due to patient preference presenting for follow up.  The patient speaks Arabic  as their primary language.  An interpreter was used for the entire visit.   She reports ***.    PERTINENT  PMH / PSH/Family/Social History : ***  OBJECTIVE:   LMP 08/03/2020   Today's weight:  Review of prior weights: There were no vitals filed for this visit.  ***  ASSESSMENT/PLAN:   Assessment & Plan    Destiny Starr, MD  Family Medicine Teaching Service  [facility]

## 2023-04-02 ENCOUNTER — Ambulatory Visit: Payer: Self-pay | Admitting: Family Medicine

## 2023-04-02 DIAGNOSIS — E1165 Type 2 diabetes mellitus with hyperglycemia: Secondary | ICD-10-CM

## 2024-04-03 ENCOUNTER — Encounter: Payer: Self-pay | Admitting: Family Medicine

## 2024-04-11 ENCOUNTER — Encounter: Payer: Self-pay | Admitting: Family Medicine
